# Patient Record
Sex: Male | Born: 1955 | Race: White | Hispanic: Yes | Marital: Married | Smoking: Former smoker
Health system: Southern US, Community
[De-identification: ages and names within clinical notes are randomized; demographics above are authoritative.]

## PROBLEM LIST (undated history)

## (undated) DIAGNOSIS — E538 Deficiency of other specified B group vitamins: Secondary | ICD-10-CM

## (undated) DIAGNOSIS — Z8619 Personal history of other infectious and parasitic diseases: Secondary | ICD-10-CM

## (undated) DIAGNOSIS — E785 Hyperlipidemia, unspecified: Secondary | ICD-10-CM

## (undated) DIAGNOSIS — D649 Anemia, unspecified: Secondary | ICD-10-CM

## (undated) DIAGNOSIS — E119 Type 2 diabetes mellitus without complications: Secondary | ICD-10-CM

## (undated) DIAGNOSIS — A048 Other specified bacterial intestinal infections: Secondary | ICD-10-CM

## (undated) DIAGNOSIS — F419 Anxiety disorder, unspecified: Secondary | ICD-10-CM

## (undated) DIAGNOSIS — I10 Essential (primary) hypertension: Secondary | ICD-10-CM

## (undated) DIAGNOSIS — K2970 Gastritis, unspecified, without bleeding: Secondary | ICD-10-CM

## (undated) HISTORY — PX: EYE SURGERY: SHX253

## (undated) HISTORY — PX: JOINT REPLACEMENT: SHX530

## (undated) HISTORY — PX: COLONOSCOPY: SHX174

## (undated) HISTORY — PX: OTHER SURGICAL HISTORY: SHX169

---

## 2007-07-22 ENCOUNTER — Other Ambulatory Visit: Payer: Self-pay

## 2007-07-22 ENCOUNTER — Ambulatory Visit: Payer: Self-pay | Admitting: Ophthalmology

## 2007-07-29 ENCOUNTER — Ambulatory Visit: Payer: Self-pay | Admitting: Ophthalmology

## 2008-10-29 ENCOUNTER — Ambulatory Visit: Payer: Self-pay | Admitting: Gastroenterology

## 2011-03-26 ENCOUNTER — Ambulatory Visit: Payer: Self-pay | Admitting: Internal Medicine

## 2011-04-04 ENCOUNTER — Ambulatory Visit: Payer: Self-pay | Admitting: Internal Medicine

## 2011-05-05 ENCOUNTER — Ambulatory Visit: Payer: Self-pay | Admitting: Internal Medicine

## 2011-07-09 ENCOUNTER — Ambulatory Visit: Payer: Self-pay | Admitting: Internal Medicine

## 2011-08-04 ENCOUNTER — Ambulatory Visit: Payer: Self-pay | Admitting: Internal Medicine

## 2011-12-18 ENCOUNTER — Ambulatory Visit: Payer: Self-pay | Admitting: Internal Medicine

## 2011-12-18 LAB — CBC CANCER CENTER
Basophil %: 0.6 %
Eosinophil %: 2 %
HCT: 37.1 % — ABNORMAL LOW (ref 40.0–52.0)
Lymphocyte %: 37.5 %
MCH: 28.3 pg (ref 26.0–34.0)
MCHC: 33.3 g/dL (ref 32.0–36.0)
Monocyte #: 0.4 x10 3/mm (ref 0.2–1.0)
Monocyte %: 6.7 %
Neutrophil #: 3.3 x10 3/mm (ref 1.4–6.5)
RDW: 14.2 % (ref 11.5–14.5)
WBC: 6.3 x10 3/mm (ref 3.8–10.6)

## 2011-12-18 LAB — IRON AND TIBC
Iron Bind.Cap.(Total): 295 ug/dL (ref 250–450)
Iron Saturation: 28 %

## 2011-12-18 LAB — FERRITIN: Ferritin (ARMC): 65 ng/mL (ref 8–388)

## 2012-01-02 ENCOUNTER — Ambulatory Visit: Payer: Self-pay | Admitting: Internal Medicine

## 2012-01-15 LAB — IRON AND TIBC
Iron Bind.Cap.(Total): 295 ug/dL (ref 250–450)
Iron Saturation: 22 %
Iron: 66 ug/dL (ref 65–175)

## 2012-01-15 LAB — FERRITIN: Ferritin (ARMC): 70 ng/mL (ref 8–388)

## 2012-01-15 LAB — CBC CANCER CENTER
Basophil #: 0 x10 3/mm (ref 0.0–0.1)
Basophil %: 0.8 %
Eosinophil #: 0.2 x10 3/mm (ref 0.0–0.7)
HGB: 12.8 g/dL — ABNORMAL LOW (ref 13.0–18.0)
Lymphocyte %: 29.6 %
MCH: 28.4 pg (ref 26.0–34.0)
MCHC: 33.7 g/dL (ref 32.0–36.0)
Monocyte %: 5.4 %
Neutrophil #: 3.7 x10 3/mm (ref 1.4–6.5)
RBC: 4.5 10*6/uL (ref 4.40–5.90)
RDW: 13.7 % (ref 11.5–14.5)

## 2012-02-02 ENCOUNTER — Ambulatory Visit: Payer: Self-pay | Admitting: Internal Medicine

## 2012-03-25 ENCOUNTER — Ambulatory Visit: Payer: Self-pay | Admitting: Internal Medicine

## 2012-03-25 LAB — CBC CANCER CENTER
Basophil %: 0.5 %
Eosinophil #: 0.2 x10 3/mm (ref 0.0–0.7)
Eosinophil %: 2.4 %
HCT: 35.6 % — ABNORMAL LOW (ref 40.0–52.0)
HGB: 11.9 g/dL — ABNORMAL LOW (ref 13.0–18.0)
Lymphocyte #: 2.4 x10 3/mm (ref 1.0–3.6)
MCH: 28.6 pg (ref 26.0–34.0)
MCHC: 33.5 g/dL (ref 32.0–36.0)
Monocyte #: 0.5 x10 3/mm (ref 0.2–1.0)
Neutrophil #: 3.8 x10 3/mm (ref 1.4–6.5)
Neutrophil %: 55 %
Platelet: 241 x10 3/mm (ref 150–440)
RBC: 4.18 10*6/uL — ABNORMAL LOW (ref 4.40–5.90)
RDW: 14.1 % (ref 11.5–14.5)

## 2012-04-03 ENCOUNTER — Ambulatory Visit: Payer: Self-pay | Admitting: Internal Medicine

## 2012-04-22 LAB — CBC CANCER CENTER
Basophil #: 0 x10 3/mm (ref 0.0–0.1)
Basophil %: 0.3 %
Eosinophil #: 0.1 x10 3/mm (ref 0.0–0.7)
Eosinophil %: 2 %
HCT: 36.4 % — ABNORMAL LOW (ref 40.0–52.0)
HGB: 12.3 g/dL — ABNORMAL LOW (ref 13.0–18.0)
Lymphocyte #: 2.5 x10 3/mm (ref 1.0–3.6)
Lymphocyte %: 38.2 %
MCH: 28.6 pg (ref 26.0–34.0)
MCHC: 33.8 g/dL (ref 32.0–36.0)
MCV: 85 fL (ref 80–100)
Monocyte #: 0.4 x10 3/mm (ref 0.2–1.0)
Neutrophil #: 3.5 x10 3/mm (ref 1.4–6.5)
RBC: 4.3 10*6/uL — ABNORMAL LOW (ref 4.40–5.90)
RDW: 14 % (ref 11.5–14.5)

## 2012-05-04 ENCOUNTER — Ambulatory Visit: Payer: Self-pay | Admitting: Internal Medicine

## 2012-05-16 ENCOUNTER — Ambulatory Visit: Payer: Self-pay | Admitting: Gastroenterology

## 2012-05-19 LAB — PATHOLOGY REPORT

## 2012-11-22 IMAGING — US ABDOMEN ULTRASOUND
1 series · 17 of 25 positions shown · non-contrast
Comparison: none

REASON FOR EXAM: anemia
COMMENTS:

[Series 1: abdomen ultrasound · 17 of 59 slices shown]
[im 1/59]
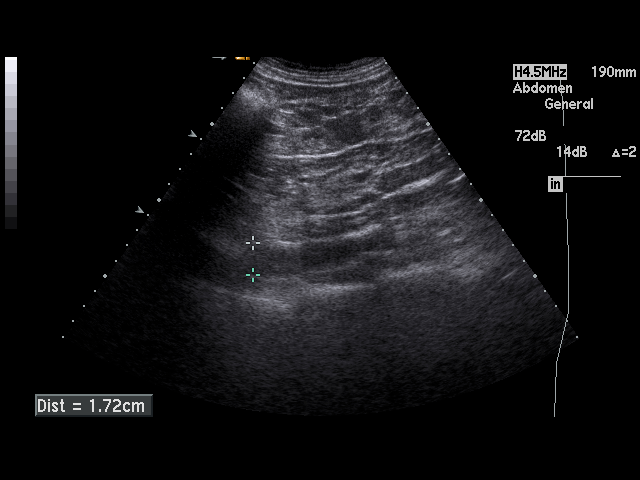
[im 5/59]
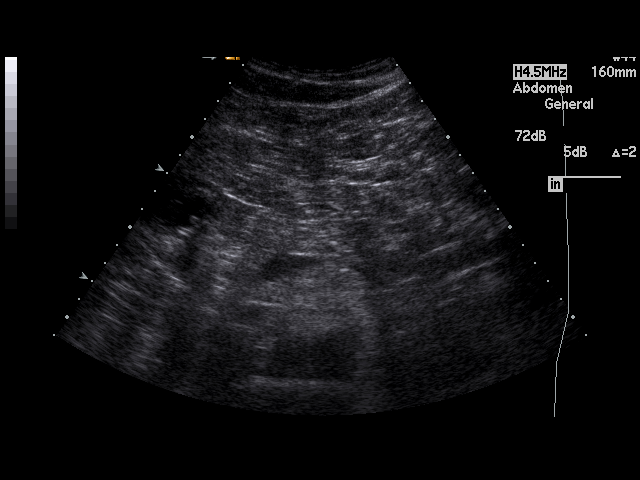
[im 8/59]
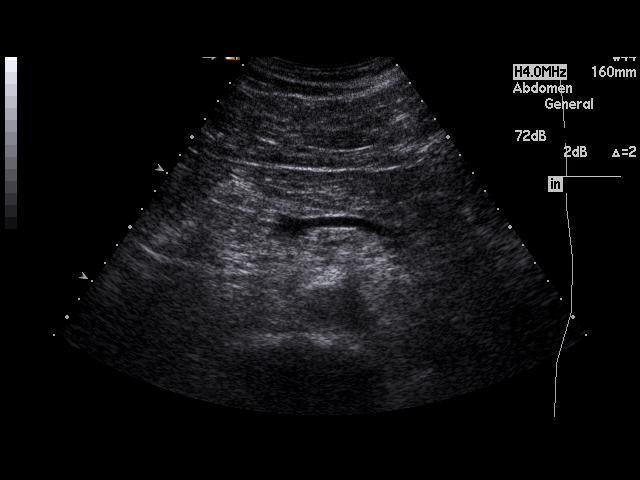
[im 13/59]
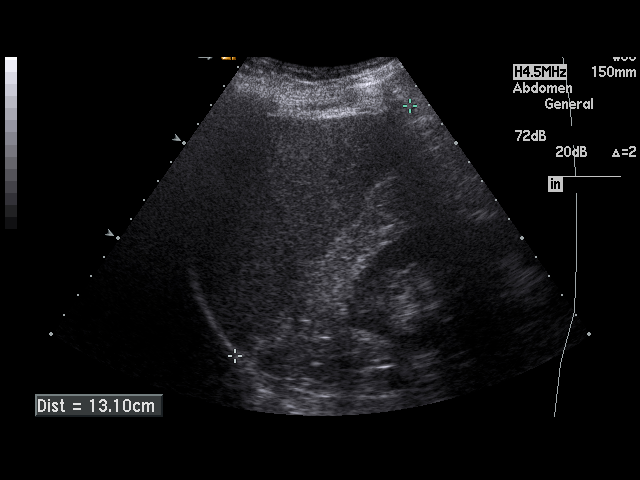
[im 15/59]
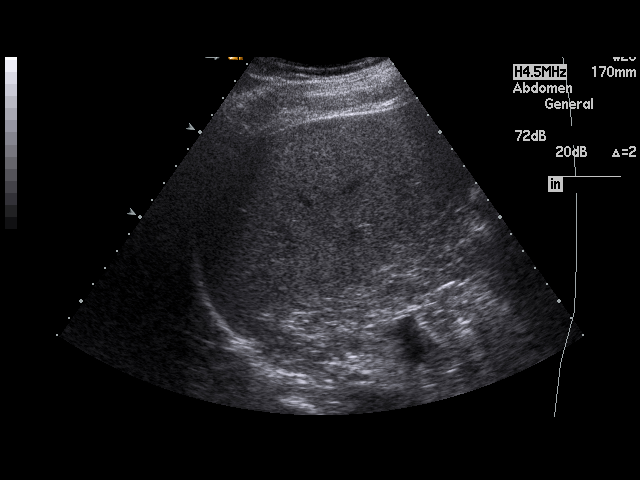
[im 20/59]
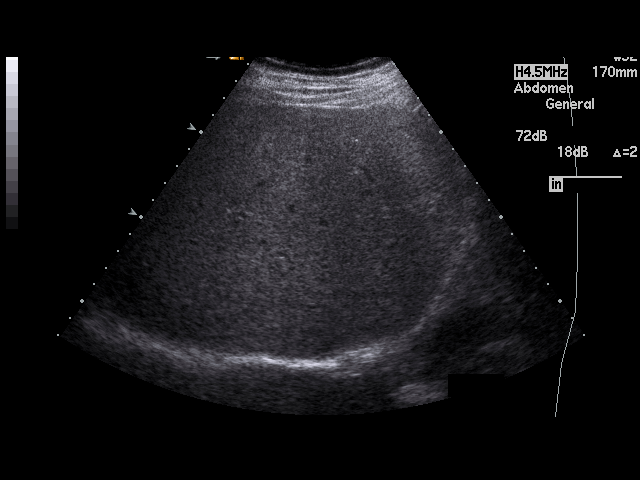
[im 22/59]
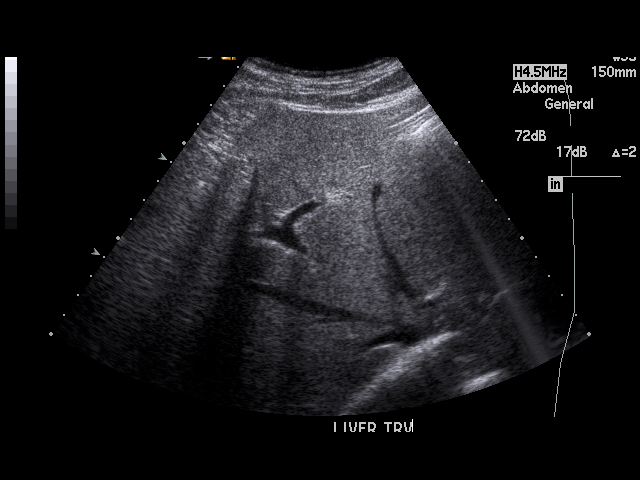
[im 27/59]
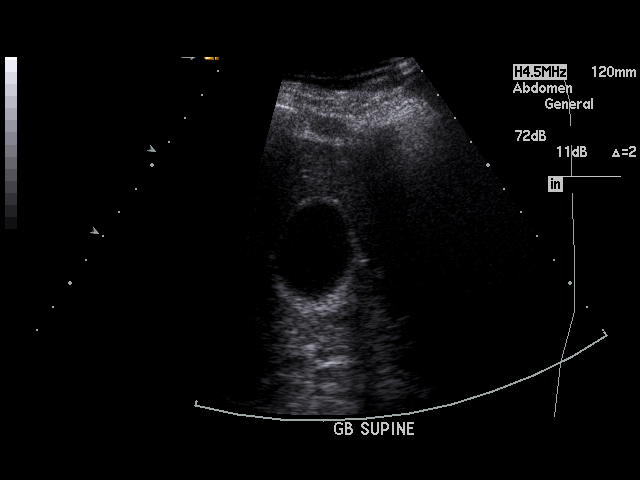
[im 30/59]
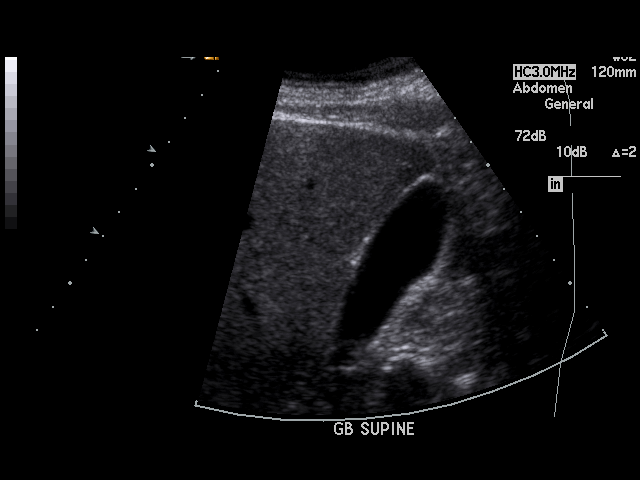
[im 32/59]
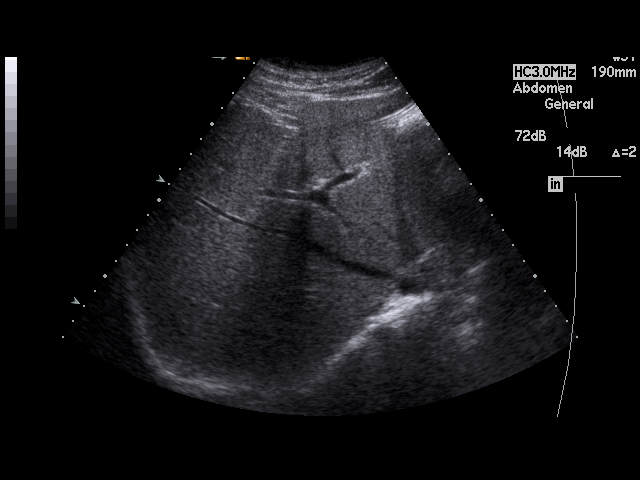
[im 37/59]
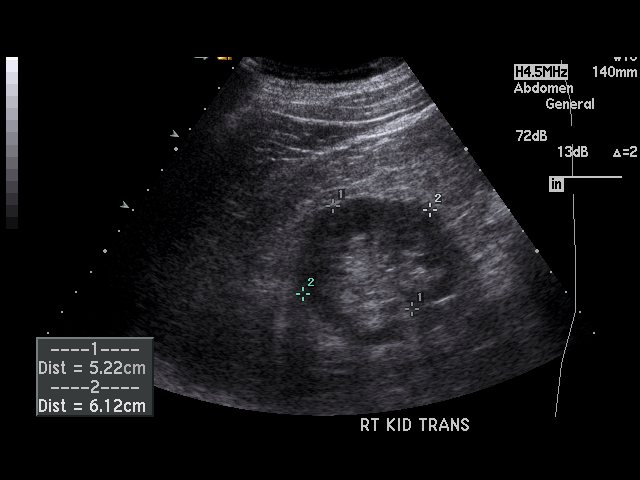
[im 39/59]
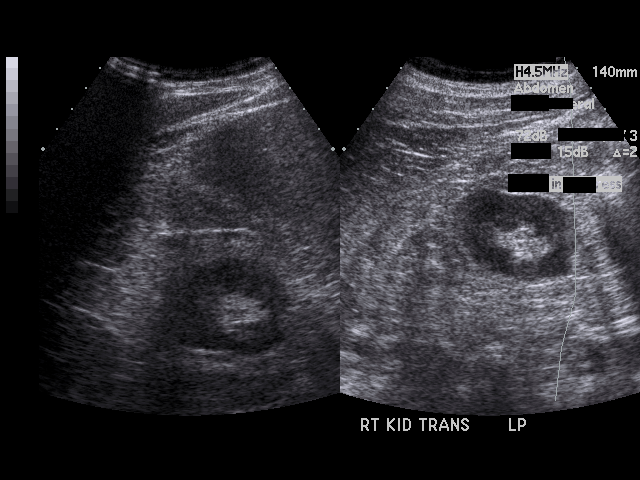
[im 44/59]
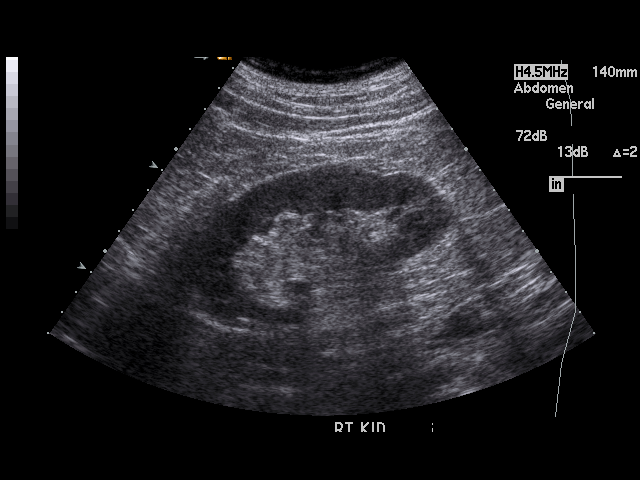
[im 46/59]
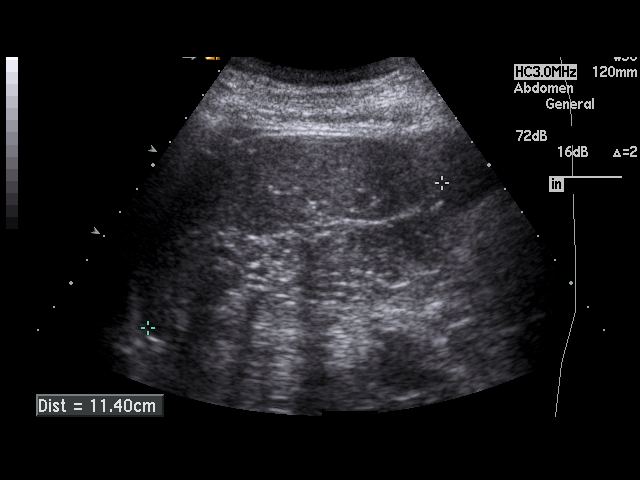
[im 51/59]
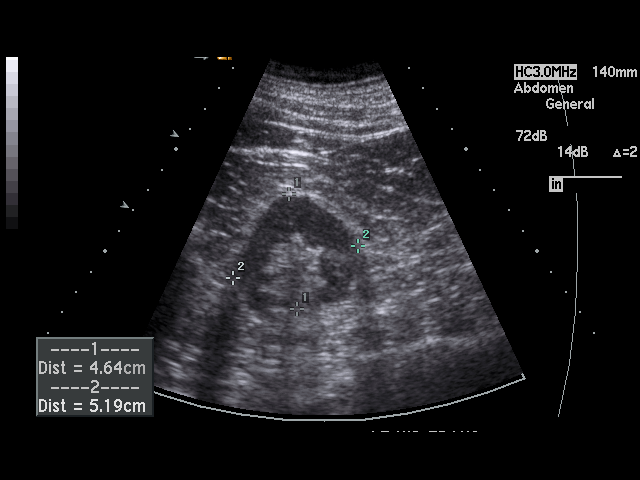
[im 54/59]
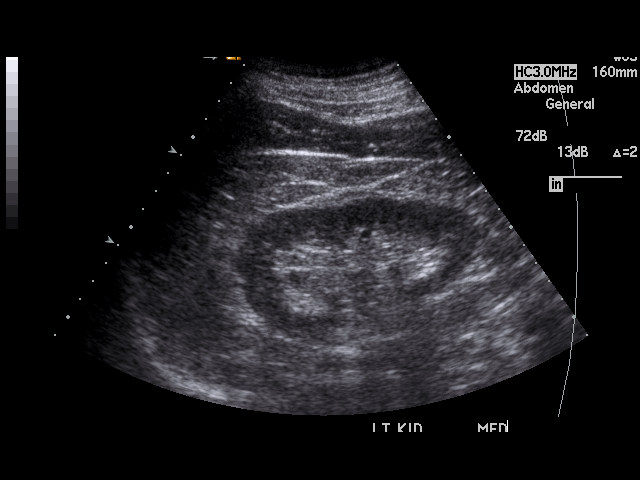
[im 59/59]
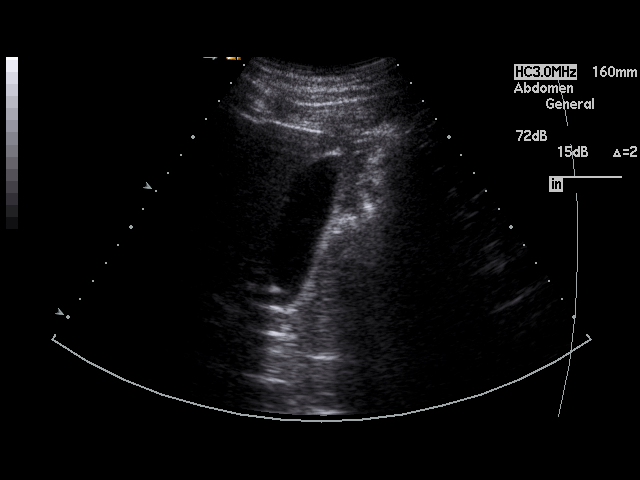

[17 of 25 positions shown; findings below may reference images not displayed]

PROCEDURE:     [facility] - [facility] ABDOMEN GENERAL SURVEY  - January 15, 2012  [DATE]

RESULT:     The liver, spleen, abdominal aorta and inferior vena cava show
no significant abnormalities. The head and body of the pancreas are
visualized and are normal in appearance. The pancreatic tail is obscured by
bowel gas. No gallstones are seen. There is no thickening of the gallbladder
wall. Common bile duct measures 3.1 mm in diameter which is within normal
limits. The kidneys show no hydronephrosis. Sagittally, the right kidney
measures 11.0 cm and the left measures 11.2 cm. No ascites is seen.
IMPRESSION: No gallstones or other significant abnormality is identified.

## 2017-02-08 DIAGNOSIS — H2513 Age-related nuclear cataract, bilateral: Secondary | ICD-10-CM | POA: Diagnosis not present

## 2017-02-13 DIAGNOSIS — E782 Mixed hyperlipidemia: Secondary | ICD-10-CM | POA: Diagnosis not present

## 2017-02-13 DIAGNOSIS — D638 Anemia in other chronic diseases classified elsewhere: Secondary | ICD-10-CM | POA: Diagnosis not present

## 2017-02-13 DIAGNOSIS — E538 Deficiency of other specified B group vitamins: Secondary | ICD-10-CM | POA: Diagnosis not present

## 2017-02-13 DIAGNOSIS — E119 Type 2 diabetes mellitus without complications: Secondary | ICD-10-CM | POA: Diagnosis not present

## 2017-02-13 DIAGNOSIS — Z79899 Other long term (current) drug therapy: Secondary | ICD-10-CM | POA: Diagnosis not present

## 2017-04-19 DIAGNOSIS — Z8619 Personal history of other infectious and parasitic diseases: Secondary | ICD-10-CM | POA: Diagnosis not present

## 2017-04-24 DIAGNOSIS — H2512 Age-related nuclear cataract, left eye: Secondary | ICD-10-CM | POA: Diagnosis not present

## 2017-05-01 ENCOUNTER — Encounter: Payer: Self-pay | Admitting: *Deleted

## 2017-05-09 ENCOUNTER — Ambulatory Visit
Admission: RE | Admit: 2017-05-09 | Discharge: 2017-05-09 | Disposition: A | Discharge status: Home / Self Care | Payer: Commercial Managed Care - PPO | Source: Ambulatory Visit | Attending: Ophthalmology | Admitting: Ophthalmology

## 2017-05-09 ENCOUNTER — Ambulatory Visit: Payer: Commercial Managed Care - PPO | Admitting: Anesthesiology

## 2017-05-09 ENCOUNTER — Encounter
Admission: RE | Disposition: A | Discharge status: Home / Self Care | Payer: Self-pay | Source: Ambulatory Visit | Attending: Ophthalmology

## 2017-05-09 ENCOUNTER — Encounter: Payer: Self-pay | Admitting: *Deleted

## 2017-05-09 DIAGNOSIS — Z79899 Other long term (current) drug therapy: Secondary | ICD-10-CM | POA: Insufficient documentation

## 2017-05-09 DIAGNOSIS — D649 Anemia, unspecified: Secondary | ICD-10-CM | POA: Diagnosis not present

## 2017-05-09 DIAGNOSIS — Z6833 Body mass index (BMI) 33.0-33.9, adult: Secondary | ICD-10-CM | POA: Diagnosis not present

## 2017-05-09 DIAGNOSIS — Z9889 Other specified postprocedural states: Secondary | ICD-10-CM | POA: Diagnosis not present

## 2017-05-09 DIAGNOSIS — E119 Type 2 diabetes mellitus without complications: Secondary | ICD-10-CM | POA: Insufficient documentation

## 2017-05-09 DIAGNOSIS — H2512 Age-related nuclear cataract, left eye: Secondary | ICD-10-CM | POA: Insufficient documentation

## 2017-05-09 DIAGNOSIS — Z7984 Long term (current) use of oral hypoglycemic drugs: Secondary | ICD-10-CM | POA: Diagnosis not present

## 2017-05-09 DIAGNOSIS — E669 Obesity, unspecified: Secondary | ICD-10-CM | POA: Insufficient documentation

## 2017-05-09 DIAGNOSIS — E7800 Pure hypercholesterolemia, unspecified: Secondary | ICD-10-CM | POA: Insufficient documentation

## 2017-05-09 DIAGNOSIS — Z87891 Personal history of nicotine dependence: Secondary | ICD-10-CM | POA: Diagnosis not present

## 2017-05-09 DIAGNOSIS — I10 Essential (primary) hypertension: Secondary | ICD-10-CM | POA: Diagnosis not present

## 2017-05-09 HISTORY — DX: Anemia, unspecified: D64.9

## 2017-05-09 HISTORY — PX: CATARACT EXTRACTION W/PHACO: SHX586

## 2017-05-09 HISTORY — DX: Essential (primary) hypertension: I10

## 2017-05-09 HISTORY — DX: Type 2 diabetes mellitus without complications: E11.9

## 2017-05-09 LAB — GLUCOSE, CAPILLARY: GLUCOSE-CAPILLARY: 162 mg/dL — AB (ref 65–99)

## 2017-05-09 SURGERY — PHACOEMULSIFICATION, CATARACT, WITH IOL INSERTION
Anesthesia: Monitor Anesthesia Care | Site: Eye | Laterality: Left | Wound class: Clean

## 2017-05-09 MED ORDER — EPINEPHRINE PF 1 MG/ML IJ SOLN
INTRAMUSCULAR | Status: AC
  Filled 2017-05-09: qty 1

## 2017-05-09 MED ORDER — BSS IO SOLN
INTRAOCULAR | Status: DC | PRN
  Administered 2017-05-09: 200 mL via INTRAOCULAR

## 2017-05-09 MED ORDER — MIDAZOLAM HCL 2 MG/2ML IJ SOLN
INTRAMUSCULAR | Status: AC
  Filled 2017-05-09: qty 2

## 2017-05-09 MED ORDER — ARMC OPHTHALMIC DILATING DROPS
OPHTHALMIC | Status: AC
  Filled 2017-05-09: qty 0.4

## 2017-05-09 MED ORDER — MOXIFLOXACIN HCL 0.5 % OP SOLN: 1.0000 [drp] | OPHTHALMIC | Status: DC | PRN

## 2017-05-09 MED ORDER — POVIDONE-IODINE 5 % OP SOLN
OPHTHALMIC | Status: DC | PRN
  Administered 2017-05-09: 1 via OPHTHALMIC

## 2017-05-09 MED ORDER — MOXIFLOXACIN HCL 0.5 % OP SOLN
OPHTHALMIC | Status: DC | PRN
  Administered 2017-05-09: 0.2 mL via OPHTHALMIC

## 2017-05-09 MED ORDER — POVIDONE-IODINE 5 % OP SOLN
OPHTHALMIC | Status: AC
  Filled 2017-05-09: qty 30

## 2017-05-09 MED ORDER — ARMC OPHTHALMIC DILATING DROPS
1.0000 "application " | OPHTHALMIC | Status: DC
  Administered 2017-05-09 (×3): 1 via OPHTHALMIC

## 2017-05-09 MED ORDER — NA CHONDROIT SULF-NA HYALURON 40-17 MG/ML IO SOLN
INTRAOCULAR | Status: AC
  Filled 2017-05-09: qty 1

## 2017-05-09 MED ORDER — MOXIFLOXACIN HCL 0.5 % OP SOLN
OPHTHALMIC | Status: AC
  Filled 2017-05-09: qty 3

## 2017-05-09 MED ORDER — LIDOCAINE HCL (PF) 4 % IJ SOLN
INTRAMUSCULAR | Status: AC
  Filled 2017-05-09: qty 5

## 2017-05-09 MED ORDER — SODIUM CHLORIDE 0.9 % IV SOLN
INTRAVENOUS | Status: DC
  Administered 2017-05-09 (×2): via INTRAVENOUS

## 2017-05-09 MED ORDER — LIDOCAINE HCL (PF) 4 % IJ SOLN
INTRAOCULAR | Status: DC | PRN
  Administered 2017-05-09: 4 mL via OPHTHALMIC

## 2017-05-09 MED ORDER — MIDAZOLAM HCL 5 MG/5ML IJ SOLN
INTRAMUSCULAR | Status: DC | PRN
  Administered 2017-05-09: 2 mg via INTRAVENOUS

## 2017-05-09 SURGICAL SUPPLY — 16 items
DISSECTOR HYDRO NUCLEUS 50X22 (MISCELLANEOUS) ×2 IMPLANT
GLOVE BIO SURGEON STRL SZ8 (GLOVE) ×2 IMPLANT
GLOVE BIOGEL M 6.5 STRL (GLOVE) ×2 IMPLANT
GLOVE SURG LX 7.5 STRW (GLOVE) ×1
GLOVE SURG LX STRL 7.5 STRW (GLOVE) ×1 IMPLANT
GOWN STRL REUS W/ TWL LRG LVL3 (GOWN DISPOSABLE) ×2 IMPLANT
GOWN STRL REUS W/TWL LRG LVL3 (GOWN DISPOSABLE) ×2
LABEL CATARACT MEDS ST (LABEL) ×2 IMPLANT
LENS IOL TECNIS ITEC 23.5 (Intraocular Lens) ×2 IMPLANT
PACK CATARACT (MISCELLANEOUS) ×2 IMPLANT
PACK CATARACT KING (MISCELLANEOUS) ×2 IMPLANT
PACK EYE AFTER SURG (MISCELLANEOUS) ×2 IMPLANT
SOL BSS BAG (MISCELLANEOUS) ×2
SOLUTION BSS BAG (MISCELLANEOUS) ×1 IMPLANT
WATER STERILE IRR 250ML POUR (IV SOLUTION) ×2 IMPLANT
WIPE NON LINTING 3.25X3.25 (MISCELLANEOUS) ×2 IMPLANT

## 2017-05-09 NOTE — Anesthesia Postprocedure Evaluation (Signed)
Anesthesia Post Note  Patient: Greg Stevenson  Procedure(s) Performed: Procedure(s) (LRB): CATARACT EXTRACTION PHACO AND INTRAOCULAR LENS PLACEMENT (IOC) (Left)  Patient location during evaluation: PACU Anesthesia Type: MAC Level of consciousness: awake and alert Pain management: pain level controlled Vital Signs Assessment: post-procedure vital signs reviewed and stable Respiratory status: spontaneous breathing, nonlabored ventilation and respiratory function stable Cardiovascular status: stable and blood pressure returned to baseline Anesthetic complications: no     Last Vitals:  Vitals:   05/09/17 0751 05/09/17 0946  BP: (!) 181/84 (!) 132/56  Pulse: 66   Resp: 17   Temp: 36.7 C 36.8 C  SpO2: 100% 100%    Last Pain:  Vitals:   05/09/17 0946  TempSrc: Oral                 Silvana Newness A

## 2017-05-09 NOTE — Transfer of Care (Signed)
Immediate Anesthesia Transfer of Care Note  Patient: Greg Stevenson  Procedure(s) Performed: Procedure(s) with comments: CATARACT EXTRACTION PHACO AND INTRAOCULAR LENS PLACEMENT (IOC) (Left) - Lot # [id number] H [place]:   00:18.5 AP%:  10.9 CDE:    2.02  Patient Location: PACU  Anesthesia Type:MAC  Level of Consciousness: awake, alert , oriented and patient cooperative  Airway & Oxygen Therapy: Patient Spontanous Breathing  Post-op Assessment: Report given to RN, Post -op Vital signs reviewed and stable and Patient moving all extremities X 4  Post vital signs: Reviewed and stable  Last Vitals:  Vitals:   05/09/17 0751 05/09/17 0946  BP: (!) 181/84 (!) 132/56  Pulse: 66   Resp: 17   Temp: 36.7 C 36.8 C  SpO2: 100% 100%    Last Pain:  Vitals:   05/09/17 0946  TempSrc: Oral         Complications: No apparent anesthesia complications

## 2017-05-09 NOTE — Anesthesia Preprocedure Evaluation (Signed)
Anesthesia Evaluation  Patient identified by MRN, date of birth, ID band Patient awake    Reviewed: Allergy & Precautions, NPO status , Patient's Chart, lab work & pertinent test results, reviewed documented beta blocker date and time   Airway Mallampati: III  TM Distance: >3 FB     Dental  (+) Chipped   Pulmonary former smoker,           Cardiovascular hypertension, Pt. on medications and Pt. on home beta blockers      Neuro/Psych    GI/Hepatic   Endo/Other  diabetes, Type 2  Renal/GU      Musculoskeletal   Abdominal   Peds  Hematology  (+) anemia ,   Anesthesia Other Findings Obese.  Reproductive/Obstetrics                             Anesthesia Physical Anesthesia Plan  ASA: III  Anesthesia Plan: MAC   Post-op Pain Management:    Induction:   PONV Risk Score and Plan:   Airway Management Planned:   Additional Equipment:   Intra-op Plan:   Post-operative Plan:   Informed Consent: I have reviewed the patients History and Physical, chart, labs and discussed the procedure including the risks, benefits and alternatives for the proposed anesthesia with the patient or authorized representative who has indicated his/her understanding and acceptance.     Plan Discussed with: CRNA  Anesthesia Plan Comments:         Anesthesia Quick Evaluation

## 2017-05-09 NOTE — H&P (Signed)
The History and Physical notes are on paper, have been signed, and are to be scanned.   I have examined the patient and there are no changes to the H&P.   Willey BladeBradley Satin Boal 05/09/2017 9:05 AM

## 2017-05-09 NOTE — Discharge Instructions (Signed)
Eye Surgery Discharge Instructions  Expect mild scratchy sensation or mild soreness. DO NOT RUB YOUR EYE!  The day of surgery:  Minimal physical activity, but bed rest is not required  No reading, computer work, or close hand work  No bending, lifting, or straining.  May watch TV  For 24 hours:  No driving, legal decisions, or alcoholic beverages  Safety precautions  Eat anything you prefer: It is better to start with liquids, then soup then solid foods.  _____ Eye patch should be worn until postoperative exam tomorrow.  ____ Solar shield eyeglasses should be worn for comfort in the sunlight/patch while sleeping  Resume all regular medications including aspirin or Coumadin if these were discontinued prior to surgery. You may shower, bathe, shave, or wash your hair. Tylenol may be taken for mild discomfort.  Call your doctor if you experience significant pain, nausea, or vomiting, fever > 101 or other signs of infection. [phone] or [phone] Specific instructions:  Follow-up Information    [place] CraneKing, Bradley Mark, MD Follow up.   Specialty:  Ophthalmology Why:  september 7 at 9:35am Contact information: [address] [place] [address] [phone]

## 2017-05-09 NOTE — Op Note (Signed)
OPERATIVE NOTE  Greg Stevenson [id number] 05/09/2017   PREOPERATIVE DIAGNOSIS:  Nuclear sclerotic cataract left eye.  H25.12   POSTOPERATIVE DIAGNOSIS:    Nuclear sclerotic cataract left eye.     PROCEDURE:  Phacoemusification with posterior chamber intraocular lens placement of the left eye   LENS:   Implant Name Type Inv. Item Serial No. Manufacturer Lot No. LRB No. Used  LENS IOL DIOP 23.5 - [id number] Intraocular Lens LENS IOL DIOP 23.5 [id number] AMO   Left 1       PCB00 +23.5   ULTRASOUND TIME: 0 minutes 18 seconds.  CDE 2.02   SURGEON:  Willey BladeBradley Jeanelle Dake, MD, MPH   ANESTHESIA:  Topical with tetracaine drops augmented with 1% preservative-free intracameral lidocaine.  ESTIMATED BLOOD LOSS: <1 mL   COMPLICATIONS:  None.   DESCRIPTION OF PROCEDURE:  The patient was identified in the holding room and transported to the operating room and placed in the supine position under the operating microscope.  The left eye was identified as the operative eye and it was prepped and draped in the usual sterile ophthalmic fashion.   A 1.0 millimeter clear-corneal paracentesis was made at the 5:00 position. 0.5 ml of preservative-free 1% lidocaine with epinephrine was injected into the anterior chamber.  The anterior chamber was filled with Discovisc viscoelastic.  A 2.4 millimeter keratome was used to make a near-clear corneal incision at the 2:00 position.  A curvilinear capsulorrhexis was made with a cystotome and capsulorrhexis forceps.  Balanced salt solution was used to hydrodissect and hydrodelineate the nucleus.   Phacoemulsification was then used in stop and chop fashion to remove the lens nucleus and epinucleus.  The remaining cortex was then removed using the irrigation and aspiration handpiece. Discovisc was then placed into the capsular bag to distend it for lens placement.  A lens was then injected into the capsular bag.  The remaining viscoelastic was aspirated.   Wounds were  hydrated with balanced salt solution.  The anterior chamber was inflated to a physiologic pressure with balanced salt solution.  Intracameral vigamox 0.1 mL undiltued was injected into the eye and a drop placed onto the ocular surface.  No wound leaks were noted.  The patient was taken to the recovery room in stable condition without complications of anesthesia or surgery  Willey BladeBradley Ermalinda Joubert 05/09/2017, 9:42 AM

## 2017-05-09 NOTE — Anesthesia Post-op Follow-up Note (Signed)
Anesthesia QCDR form completed.        

## 2017-05-15 ENCOUNTER — Encounter: Payer: Self-pay | Admitting: Ophthalmology

## 2017-06-14 DIAGNOSIS — H2511 Age-related nuclear cataract, right eye: Secondary | ICD-10-CM | POA: Diagnosis not present

## 2017-06-25 ENCOUNTER — Encounter: Payer: Self-pay | Admitting: *Deleted

## 2017-06-27 ENCOUNTER — Ambulatory Visit: Payer: Commercial Managed Care - PPO | Admitting: Anesthesiology

## 2017-06-27 ENCOUNTER — Encounter
Admission: RE | Disposition: A | Discharge status: Home / Self Care | Payer: Self-pay | Source: Ambulatory Visit | Attending: Ophthalmology

## 2017-06-27 ENCOUNTER — Encounter: Payer: Self-pay | Admitting: *Deleted

## 2017-06-27 ENCOUNTER — Ambulatory Visit
Admission: RE | Admit: 2017-06-27 | Discharge: 2017-06-27 | Disposition: A | Discharge status: Home / Self Care | Payer: Commercial Managed Care - PPO | Source: Ambulatory Visit | Attending: Ophthalmology | Admitting: Ophthalmology

## 2017-06-27 DIAGNOSIS — I10 Essential (primary) hypertension: Secondary | ICD-10-CM | POA: Insufficient documentation

## 2017-06-27 DIAGNOSIS — Z9849 Cataract extraction status, unspecified eye: Secondary | ICD-10-CM | POA: Insufficient documentation

## 2017-06-27 DIAGNOSIS — H2511 Age-related nuclear cataract, right eye: Secondary | ICD-10-CM | POA: Insufficient documentation

## 2017-06-27 DIAGNOSIS — Z87891 Personal history of nicotine dependence: Secondary | ICD-10-CM | POA: Insufficient documentation

## 2017-06-27 DIAGNOSIS — E1136 Type 2 diabetes mellitus with diabetic cataract: Secondary | ICD-10-CM | POA: Diagnosis not present

## 2017-06-27 HISTORY — PX: CATARACT EXTRACTION W/PHACO: SHX586

## 2017-06-27 LAB — GLUCOSE, CAPILLARY: GLUCOSE-CAPILLARY: 214 mg/dL — AB (ref 65–99)

## 2017-06-27 SURGERY — PHACOEMULSIFICATION, CATARACT, WITH IOL INSERTION
Anesthesia: Monitor Anesthesia Care | Site: Eye | Laterality: Right | Wound class: Clean

## 2017-06-27 MED ORDER — NA CHONDROIT SULF-NA HYALURON 40-17 MG/ML IO SOLN
INTRAOCULAR | Status: AC
  Filled 2017-06-27: qty 1

## 2017-06-27 MED ORDER — MIDAZOLAM HCL 2 MG/2ML IJ SOLN
INTRAMUSCULAR | Status: DC | PRN
  Administered 2017-06-27: 2 mg via INTRAVENOUS

## 2017-06-27 MED ORDER — MOXIFLOXACIN HCL 0.5 % OP SOLN
OPHTHALMIC | Status: AC
  Filled 2017-06-27: qty 3

## 2017-06-27 MED ORDER — MOXIFLOXACIN HCL 0.5 % OP SOLN: 1.0000 [drp] | OPHTHALMIC | Status: DC | PRN

## 2017-06-27 MED ORDER — EPINEPHRINE PF 1 MG/ML IJ SOLN
INTRAOCULAR | Status: DC | PRN
  Administered 2017-06-27: 08:00:00 via OPHTHALMIC

## 2017-06-27 MED ORDER — PROVISC 10 MG/ML IO SOLN
INTRAOCULAR | Status: DC | PRN
  Administered 2017-06-27: 0.85 mL via INTRAOCULAR

## 2017-06-27 MED ORDER — ARMC OPHTHALMIC DILATING DROPS
1.0000 "application " | OPHTHALMIC | Status: AC
  Administered 2017-06-27 (×3): 1 via OPHTHALMIC

## 2017-06-27 MED ORDER — MOXIFLOXACIN HCL 0.5 % OP SOLN
OPHTHALMIC | Status: DC | PRN
  Administered 2017-06-27: 0.2 mL via OPHTHALMIC

## 2017-06-27 MED ORDER — POVIDONE-IODINE 5 % OP SOLN
OPHTHALMIC | Status: DC | PRN
  Administered 2017-06-27: 1 via OPHTHALMIC

## 2017-06-27 MED ORDER — MIDAZOLAM HCL 2 MG/2ML IJ SOLN
INTRAMUSCULAR | Status: AC
  Filled 2017-06-27: qty 2

## 2017-06-27 MED ORDER — CARBACHOL 0.01 % IO SOLN
INTRAOCULAR | Status: DC | PRN
  Administered 2017-06-27: 0.5 mL via INTRAOCULAR

## 2017-06-27 MED ORDER — POVIDONE-IODINE 5 % OP SOLN
OPHTHALMIC | Status: AC
  Filled 2017-06-27: qty 30

## 2017-06-27 MED ORDER — EPINEPHRINE PF 1 MG/ML IJ SOLN
INTRAMUSCULAR | Status: AC
  Filled 2017-06-27: qty 1

## 2017-06-27 MED ORDER — LIDOCAINE HCL (PF) 4 % IJ SOLN
INTRAMUSCULAR | Status: AC
  Filled 2017-06-27: qty 5

## 2017-06-27 MED ORDER — ARMC OPHTHALMIC DILATING DROPS
OPHTHALMIC | Status: AC
  Administered 2017-06-27: 1 via OPHTHALMIC
  Filled 2017-06-27: qty 0.4

## 2017-06-27 MED ORDER — SODIUM CHLORIDE 0.9 % IV SOLN
INTRAVENOUS | Status: DC
  Administered 2017-06-27: 07:00:00 via INTRAVENOUS

## 2017-06-27 MED ORDER — LIDOCAINE HCL (PF) 4 % IJ SOLN
INTRAOCULAR | Status: DC | PRN
  Administered 2017-06-27: 4 mL via OPHTHALMIC

## 2017-06-27 SURGICAL SUPPLY — 16 items
DISSECTOR HYDRO NUCLEUS 50X22 (MISCELLANEOUS) ×3 IMPLANT
GLOVE BIO SURGEON STRL SZ8 (GLOVE) ×3 IMPLANT
GLOVE BIOGEL M 6.5 STRL (GLOVE) ×3 IMPLANT
GLOVE SURG LX 7.5 STRW (GLOVE) ×2
GLOVE SURG LX STRL 7.5 STRW (GLOVE) ×1 IMPLANT
GOWN STRL REUS W/ TWL LRG LVL3 (GOWN DISPOSABLE) ×2 IMPLANT
GOWN STRL REUS W/TWL LRG LVL3 (GOWN DISPOSABLE) ×4
LABEL CATARACT MEDS ST (LABEL) ×3 IMPLANT
LENS IOL TECNIS ITEC 23.5 (Intraocular Lens) ×3 IMPLANT
PACK CATARACT (MISCELLANEOUS) ×3 IMPLANT
PACK CATARACT KING (MISCELLANEOUS) ×3 IMPLANT
PACK EYE AFTER SURG (MISCELLANEOUS) ×3 IMPLANT
SOL BSS BAG (MISCELLANEOUS) ×3
SOLUTION BSS BAG (MISCELLANEOUS) ×1 IMPLANT
WATER STERILE IRR 250ML POUR (IV SOLUTION) ×3 IMPLANT
WIPE NON LINTING 3.25X3.25 (MISCELLANEOUS) ×3 IMPLANT

## 2017-06-27 NOTE — H&P (Signed)
The History and Physical notes are on paper, have been signed, and are to be scanned.   I have examined the patient and there are no changes to the H&P.   Willey BladeBradley Moustapha Tooker 06/27/2017 7:26 AM

## 2017-06-27 NOTE — Anesthesia Preprocedure Evaluation (Signed)
Anesthesia Evaluation  Patient identified by MRN, date of birth, ID band Patient awake    Reviewed: Allergy & Precautions, H&P , NPO status , Patient's Chart, lab work & pertinent test results, reviewed documented beta blocker date and time   History of Anesthesia Complications Negative for: history of anesthetic complications  Airway Mallampati: III  TM Distance: >3 FB Neck ROM: full    Dental  (+) Dental Advidsory Given, Teeth Intact   Pulmonary neg pulmonary ROS, former smoker,           Cardiovascular Exercise Tolerance: Good hypertension, (-) angina(-) CAD, (-) Past MI, (-) Cardiac Stents and (-) CABG (-) dysrhythmias (-) Valvular Problems/Murmurs     Neuro/Psych negative neurological ROS  negative psych ROS   GI/Hepatic Neg liver ROS, GERD  ,  Endo/Other  diabetes  Renal/GU negative Renal ROS  negative genitourinary   Musculoskeletal   Abdominal   Peds  Hematology negative hematology ROS (+)   Anesthesia Other Findings Past Medical History: No date: Anemia No date: Diabetes mellitus without complication (HCC) No date: Hypertension   Reproductive/Obstetrics negative OB ROS                             Anesthesia Physical Anesthesia Plan  ASA: II  Anesthesia Plan: MAC   Post-op Pain Management:    Induction: Intravenous  PONV Risk Score and Plan: 1 and Ondansetron and Dexamethasone  Airway Management Planned: Natural Airway and Nasal Cannula  Additional Equipment:   Intra-op Plan:   Post-operative Plan:   Informed Consent: I have reviewed the patients History and Physical, chart, labs and discussed the procedure including the risks, benefits and alternatives for the proposed anesthesia with the patient or authorized representative who has indicated his/her understanding and acceptance.   Dental Advisory Given  Plan Discussed with: Anesthesiologist, CRNA and  Surgeon  Anesthesia Plan Comments:         Anesthesia Quick Evaluation

## 2017-06-27 NOTE — Discharge Instructions (Signed)
Eye Surgery Discharge Instructions  Expect mild scratchy sensation or mild soreness. DO NOT RUB YOUR EYE!  The day of surgery:  Minimal physical activity, but bed rest is not required  No reading, computer work, or close hand work  No bending, lifting, or straining.  May watch TV  For 24 hours:  No driving, legal decisions, or alcoholic beverages  Safety precautions  Eat anything you prefer: It is better to start with liquids, then soup then solid foods.  _____ Eye patch should be worn until postoperative exam tomorrow.  __x__ Solar shield eyeglasses should be worn for comfort in the sunlight/patch while sleeping  Patch to be worn at bedtime for one week.   Resume all regular medications including aspirin or Coumadin if these were discontinued prior to surgery.  You may shower, bathe, shave, or wash your hair.  Tylenol may be taken for mild discomfort.  Call your doctor if you experience significant pain, nausea, or vomiting, fever > 101 or other signs of infection. [phone] or [phone] Specific instructions:  Follow-up Information    [place] CraneKing, Bradley Mark, MD Follow up on 06/28/2017.   Specialty:  Ophthalmology Why:  Follow up will be in the [place] Office at 9:15 am. Directions provided. Contact information: [address] [place] [address] [phone]

## 2017-06-27 NOTE — Anesthesia Procedure Notes (Signed)
Performed by: COOK-MARTIN, Icesis Renn Pre-anesthesia Checklist: Patient identified, Emergency Drugs available, Suction available, Patient being monitored and Timeout performed Patient Re-evaluated:Patient Re-evaluated prior to induction Oxygen Delivery Method: Nasal cannula Preoxygenation: Pre-oxygenation with 100% oxygen Induction Type: IV induction Placement Confirmation: positive ETCO2 and CO2 detector       

## 2017-06-27 NOTE — Transfer of Care (Signed)
Immediate Anesthesia Transfer of Care Note  Patient: Greg Stevenson  Procedure(s) Performed: CATARACT EXTRACTION PHACO AND INTRAOCULAR LENS PLACEMENT (IOC)-RIGHT DIABETIC (Right Eye)  Patient Location: PACU  Anesthesia Type:MAC  Level of Consciousness: awake, alert  and oriented  Airway & Oxygen Therapy: RA  Post-op Assessment: Report given to RN and Post -op Vital signs reviewed and stable  Post vital signs: Reviewed and stable  Last Vitals:  Vitals:   06/27/17 0621  BP: (!) 172/71  Pulse: 62  Resp: 18  Temp: (!) 35.9 C  SpO2: 99%    Last Pain:  Vitals:   06/27/17 0621  TempSrc: Oral         Complications: No apparent anesthesia complications

## 2017-06-27 NOTE — Op Note (Signed)
OPERATIVE NOTE  Greg Stevenson [id number] 06/27/2017   PREOPERATIVE DIAGNOSIS:  Nuclear sclerotic cataract right eye.  H25.11   POSTOPERATIVE DIAGNOSIS:    Nuclear sclerotic cataract right eye.     PROCEDURE:  Phacoemusification with posterior chamber intraocular lens placement of the right eye   LENS:   Implant Name Type Inv. Item Serial No. Manufacturer Lot No. LRB No. Used  LENS IOL DIOP 23.5 - [id number] Intraocular Lens LENS IOL DIOP 23.5 [id number] AMO   Right 1       PCB00 + 23.5   ULTRASOUND TIME: 0 minutes 26.9 seconds.  CDE 1.78   SURGEON:  Willey BladeBradley Jasiya Markie, MD, MPH  ANESTHESIOLOGIST: Anesthesiologist: Lenard SimmerKarenz, Andrew, MD CRNA: Tonia Ghentook-Martin, Cindy   ANESTHESIA:  Topical with tetracaine drops augmented with 1% preservative-free intracameral lidocaine.  ESTIMATED BLOOD LOSS: less than 1 mL.   COMPLICATIONS:  None.   DESCRIPTION OF PROCEDURE:  The patient was identified in the holding room and transported to the operating room and placed in the supine position under the operating microscope.  The right eye was identified as the operative eye and it was prepped and draped in the usual sterile ophthalmic fashion.   A 1.0 millimeter clear-corneal paracentesis was made at the 10:30 position. 0.5 ml of preservative-free 1% lidocaine with epinephrine was injected into the anterior chamber.  The anterior chamber was filled with Discovisc viscoelastic.  A 2.4 millimeter keratome was used to make a near-clear corneal incision at the 8:00 position.  A curvilinear capsulorrhexis was made with a cystotome and capsulorrhexis forceps.  Balanced salt solution was used to hydrodissect and hydrodelineate the nucleus.   Phacoemulsification was then used in stop and chop fashion to remove the lens nucleus and epinucleus.  The remaining cortex was then removed using the irrigation and aspiration handpiece. Discovisc was then placed into the capsular bag to distend it for lens placement.  A lens  was then injected into the capsular bag.  The remaining viscoelastic was aspirated.   Wounds were hydrated with balanced salt solution.  The anterior chamber was inflated to a physiologic pressure with balanced salt solution.   Intracameral vigamox 0.1 mL undiluted was injected into the eye and a drop placed onto the ocular surface.  No wound leaks were noted.  The patient was taken to the recovery room in stable condition without complications of anesthesia or surgery  Willey BladeBradley Sirr Kabel 06/27/2017, 8:05 AM

## 2017-06-27 NOTE — Anesthesia Post-op Follow-up Note (Signed)
Anesthesia QCDR form completed.        

## 2017-06-27 NOTE — Anesthesia Postprocedure Evaluation (Signed)
Anesthesia Post Note  Patient: Greg Stevenson  Procedure(s) Performed: CATARACT EXTRACTION PHACO AND INTRAOCULAR LENS PLACEMENT (IOC)-RIGHT DIABETIC (Right Eye)  Patient location during evaluation: PACU Anesthesia Type: MAC Level of consciousness: awake and alert Pain management: pain level controlled Vital Signs Assessment: post-procedure vital signs reviewed and stable Respiratory status: spontaneous breathing, nonlabored ventilation, respiratory function stable and patient connected to nasal cannula oxygen Cardiovascular status: stable and blood pressure returned to baseline Postop Assessment: no apparent nausea or vomiting Anesthetic complications: no     Last Vitals:  Vitals:   06/27/17 0807 06/27/17 0823  BP: 113/66 114/62  Pulse: (!) 59 62  Resp: 15 15  Temp: 36.5 C   SpO2: 99% 100%    Last Pain:  Vitals:   06/27/17 0807  TempSrc: Temporal                 Martha Clan

## 2017-06-28 ENCOUNTER — Encounter: Payer: Self-pay | Admitting: Ophthalmology

## 2017-08-15 DIAGNOSIS — E538 Deficiency of other specified B group vitamins: Secondary | ICD-10-CM | POA: Diagnosis not present

## 2017-08-15 DIAGNOSIS — D638 Anemia in other chronic diseases classified elsewhere: Secondary | ICD-10-CM | POA: Diagnosis not present

## 2017-08-15 DIAGNOSIS — E119 Type 2 diabetes mellitus without complications: Secondary | ICD-10-CM | POA: Diagnosis not present

## 2017-08-15 DIAGNOSIS — Z23 Encounter for immunization: Secondary | ICD-10-CM | POA: Diagnosis not present

## 2017-08-15 DIAGNOSIS — Z79899 Other long term (current) drug therapy: Secondary | ICD-10-CM | POA: Diagnosis not present

## 2017-08-15 DIAGNOSIS — Z0000 Encounter for general adult medical examination without abnormal findings: Secondary | ICD-10-CM | POA: Diagnosis not present

## 2017-08-15 DIAGNOSIS — E782 Mixed hyperlipidemia: Secondary | ICD-10-CM | POA: Diagnosis not present

## 2017-09-16 ENCOUNTER — Ambulatory Visit: Payer: Self-pay | Admitting: Anesthesiology

## 2017-09-16 ENCOUNTER — Encounter: Payer: Self-pay | Admitting: *Deleted

## 2017-09-16 ENCOUNTER — Ambulatory Visit
Admission: RE | Admit: 2017-09-16 | Discharge: 2017-09-16 | Disposition: A | Discharge status: Home / Self Care | Payer: Self-pay | Source: Ambulatory Visit | Attending: Gastroenterology | Admitting: Gastroenterology

## 2017-09-16 ENCOUNTER — Encounter
Admission: RE | Disposition: A | Discharge status: Home / Self Care | Payer: Self-pay | Source: Ambulatory Visit | Attending: Gastroenterology

## 2017-09-16 ENCOUNTER — Other Ambulatory Visit: Payer: Self-pay

## 2017-09-16 DIAGNOSIS — Z1211 Encounter for screening for malignant neoplasm of colon: Secondary | ICD-10-CM | POA: Insufficient documentation

## 2017-09-16 DIAGNOSIS — I10 Essential (primary) hypertension: Secondary | ICD-10-CM | POA: Diagnosis not present

## 2017-09-16 DIAGNOSIS — K640 First degree hemorrhoids: Secondary | ICD-10-CM | POA: Insufficient documentation

## 2017-09-16 DIAGNOSIS — Z7984 Long term (current) use of oral hypoglycemic drugs: Secondary | ICD-10-CM | POA: Insufficient documentation

## 2017-09-16 DIAGNOSIS — E785 Hyperlipidemia, unspecified: Secondary | ICD-10-CM | POA: Insufficient documentation

## 2017-09-16 DIAGNOSIS — E119 Type 2 diabetes mellitus without complications: Secondary | ICD-10-CM | POA: Insufficient documentation

## 2017-09-16 DIAGNOSIS — Z79899 Other long term (current) drug therapy: Secondary | ICD-10-CM | POA: Insufficient documentation

## 2017-09-16 DIAGNOSIS — Z86010 Personal history of colonic polyps: Secondary | ICD-10-CM | POA: Diagnosis not present

## 2017-09-16 DIAGNOSIS — E538 Deficiency of other specified B group vitamins: Secondary | ICD-10-CM | POA: Insufficient documentation

## 2017-09-16 DIAGNOSIS — Z7982 Long term (current) use of aspirin: Secondary | ICD-10-CM | POA: Insufficient documentation

## 2017-09-16 HISTORY — DX: Other specified bacterial intestinal infections: A04.8

## 2017-09-16 HISTORY — PX: COLONOSCOPY WITH PROPOFOL: SHX5780

## 2017-09-16 HISTORY — DX: Deficiency of other specified B group vitamins: E53.8

## 2017-09-16 HISTORY — DX: Hyperlipidemia, unspecified: E78.5

## 2017-09-16 HISTORY — DX: Gastritis, unspecified, without bleeding: K29.70

## 2017-09-16 LAB — GLUCOSE, CAPILLARY: GLUCOSE-CAPILLARY: 123 mg/dL — AB (ref 65–99)

## 2017-09-16 SURGERY — COLONOSCOPY WITH PROPOFOL
Anesthesia: General

## 2017-09-16 MED ORDER — PROPOFOL 500 MG/50ML IV EMUL
INTRAVENOUS | Status: DC | PRN
  Administered 2017-09-16: 180 ug/kg/min via INTRAVENOUS

## 2017-09-16 MED ORDER — FENTANYL CITRATE (PF) 100 MCG/2ML IJ SOLN
INTRAMUSCULAR | Status: AC
  Filled 2017-09-16: qty 2

## 2017-09-16 MED ORDER — MIDAZOLAM HCL 2 MG/2ML IJ SOLN
INTRAMUSCULAR | Status: DC | PRN
  Administered 2017-09-16: 2 mg via INTRAVENOUS

## 2017-09-16 MED ORDER — FENTANYL CITRATE (PF) 100 MCG/2ML IJ SOLN
INTRAMUSCULAR | Status: DC | PRN
  Administered 2017-09-16: 50 ug via INTRAVENOUS

## 2017-09-16 MED ORDER — SODIUM CHLORIDE 0.9 % IV SOLN: INTRAVENOUS | Status: DC

## 2017-09-16 MED ORDER — MIDAZOLAM HCL 2 MG/2ML IJ SOLN
INTRAMUSCULAR | Status: AC
  Filled 2017-09-16: qty 2

## 2017-09-16 MED ORDER — PROPOFOL 500 MG/50ML IV EMUL
INTRAVENOUS | Status: AC
  Filled 2017-09-16: qty 50

## 2017-09-16 MED ORDER — LIDOCAINE HCL (PF) 2 % IJ SOLN
INTRAMUSCULAR | Status: AC
  Filled 2017-09-16: qty 10

## 2017-09-16 MED ORDER — PHENYLEPHRINE HCL 10 MG/ML IJ SOLN
INTRAMUSCULAR | Status: DC | PRN
  Administered 2017-09-16: 100 ug via INTRAVENOUS

## 2017-09-16 MED ORDER — PROPOFOL 10 MG/ML IV BOLUS
INTRAVENOUS | Status: DC | PRN
  Administered 2017-09-16: 40 mg via INTRAVENOUS

## 2017-09-16 MED ORDER — SODIUM CHLORIDE 0.9 % IV SOLN
INTRAVENOUS | Status: DC
  Administered 2017-09-16 (×2): via INTRAVENOUS

## 2017-09-16 NOTE — Anesthesia Post-op Follow-up Note (Signed)
Anesthesia QCDR form completed.        

## 2017-09-16 NOTE — Op Note (Signed)
[facility] Patient Name: Greg Stevenson Procedure Date: 09/16/2017 10:38 AM MRN: [id number] Account #: [id number] Date of Birth: 04-Jul-1956 Admit Type: Outpatient Age: 62 Room: [facility] ENDO ROOM 1 Gender: Male Note Status: Finalized Procedure:            Colonoscopy Indications:          Personal history of colonic polyps Providers:            Christena Deem, MD Referring MD:         Neomia Dear. Harrington Challenger, MD (Referring MD) Medicines:            Monitored Anesthesia Care Complications:        No immediate complications. Procedure:            Pre-Anesthesia Assessment:                       - ASA Grade Assessment: III - A patient with severe                        systemic disease.                       After obtaining informed consent, the colonoscope was                        passed under direct vision. Throughout the procedure,                        the patient's blood pressure, pulse, and oxygen                        saturations were monitored continuously. The                        Colonoscope was introduced through the anus and                        advanced to the the cecum, identified by appendiceal                        orifice and ileocecal valve. The colonoscopy was                        performed without difficulty. The patient tolerated the                        procedure well. The quality of the bowel preparation                        was fair. Findings:      Non-bleeding internal hemorrhoids were found during retroflexion and       during anoscopy. The hemorrhoids were small and Grade I (internal       hemorrhoids that do not prolapse).      The digital rectal exam was normal.      The exam was otherwise without abnormality. Impression:           - Preparation of the colon was fair.                       - Non-bleeding internal hemorrhoids.                       -  No specimens collected. Recommendation:       - Discharge  patient to home. Procedure Code(s):    --- Professional ---                       [phone], Colonoscopy, flexible; diagnostic, including                        collection of specimen(s) by brushing or washing, when                        performed (separate procedure) Diagnosis Code(s):    --- Professional ---                       K64.0, First degree hemorrhoids                       Z86.010, Personal history of colonic polyps CPT copyright 2016 American Medical Association. All rights reserved. The codes documented in this report are preliminary and upon coder review may  be revised to meet current compliance requirements. Christena DeemMartin U Kamica Florance, MD 09/16/2017 11:10:45 AM This report has been signed electronically. Number of Addenda: 0 Note Initiated On: 09/16/2017 10:38 AM Scope Withdrawal Time: 0 hours 9 minutes 46 seconds  Total Procedure Duration: 0 hours 18 minutes 8 seconds       [facility]

## 2017-09-16 NOTE — H&P (Signed)
Outpatient short stay form Pre-procedure 09/16/2017 10:31 AM Christena Deem U  MD  Primary Physician: Dr. Hal Moralesavid Theis  Reason for visit:  Colonoscopy  History of present illness:  Patient is a 62-year-old male presenting today as above. He has a personal history of adenomatous colon polyps with his last colonoscopy being in 2013. He tolerated his prep well. He does take a daily 81 mg aspirin. He takes no other aspirin products or blood thinning agents.    Current Facility-Administered Medications:  .  0.9 %  sodium chloride infusion, , Intravenous, Continuous, Christena Deem,  U, MD, Last Rate: 20 mL/hr at 09/16/17 1020 .  0.9 %  sodium chloride infusion, , Intravenous, Continuous, Christena Deem,  U, MD  Medications Prior to Admission  Medication Sig Dispense Refill Last Dose  . aspirin EC 81 MG tablet Take 81 mg by mouth daily.   09/15/2017 at 0800  . atenolol (TENORMIN) 50 MG tablet Take 50 mg by mouth daily.   09/16/2017 at 0700  . chlorthalidone (HYGROTON) 25 MG tablet Take 25 mg by mouth daily.   09/16/2017 at 0700  . glimepiride (AMARYL) 2 MG tablet Take 2 mg by mouth daily with breakfast.   09/15/2017 at 1000  . lisinopril (PRINIVIL,ZESTRIL) 40 MG tablet Take 40 mg by mouth daily.   09/16/2017 at 0700  . lovastatin (MEVACOR) 40 MG tablet Take 40 mg by mouth at bedtime.   Past Week at Unknown time  . metFORMIN (GLUCOPHAGE-XR) 500 MG 24 hr tablet Take 500 mg by mouth 2 (two) times daily.   09/15/2017 at 1200  . vitamin B-12 (CYANOCOBALAMIN) 500 MCG tablet Take 500 mcg by mouth daily.   09/15/2017 at 0800  . metFORMIN (GLUCOPHAGE) 500 MG tablet Take 1,000 mg by mouth 2 (two) times daily with a meal.    06/25/2017     No Known Allergies   Past Medical History:  Diagnosis Date  . Anemia   . Diabetes mellitus without complication (HCC)   . Gastritis   . H. pylori infection   . Hyperlipidemia   . Hypertension   . Vitamin B12 deficiency     Review of systems:      Physical  Exam    Heart and lungs: Regular rate and rhythm without rub or gallop, lungs are bilaterally clear.    HEENT: Normocephalic atraumatic eyes are anicteric    Other:     Pertinant exam for procedure: Soft nontender nondistended bowel sounds positive normoactive    Planned proceedures: Colonoscopy and indicated procedures. I have discussed the risks benefits and complications of procedures to include not limited to bleeding, infection, perforation and the risk of sedation and the patient wishes to proceed.    Christena Deem U , MD Gastroenterology 09/16/2017  10:31 AM

## 2017-09-16 NOTE — Transfer of Care (Signed)
Immediate Anesthesia Transfer of Care Note  Patient: Greg Stevenson  Procedure(s) Performed: COLONOSCOPY WITH PROPOFOL (N/A )  Patient Location: PACU  Anesthesia Type:General  Level of Consciousness: awake  Airway & Oxygen Therapy: Patient Spontanous Breathing and Patient connected to nasal cannula oxygen  Post-op Assessment: Report given to RN and Post -op Vital signs reviewed and stable  Post vital signs: Reviewed and stable  Last Vitals:  Vitals:   09/16/17 1003 09/16/17 1124  BP: (!) 187/87   Pulse: 66   Resp: 18   Temp: (!) 35.6 C (!) (P) 35.6 C  SpO2: 100%     Last Pain:  Vitals:   09/16/17 1124  TempSrc: (P) Tympanic         Complications: No apparent anesthesia complications

## 2017-09-16 NOTE — Anesthesia Procedure Notes (Signed)
Date/Time: 09/16/2017 10:45 AM Performed by: Henrietta HooverPope, [place]remont Gavitt, CRNA Pre-anesthesia Checklist: Patient identified, Emergency Drugs available, Patient being monitored, Suction available and Timeout performed Patient Re-evaluated:Patient Re-evaluated prior to induction Oxygen Delivery Method: Nasal cannula Placement Confirmation: positive ETCO2 Dental Injury: Teeth and Oropharynx as per pre-operative assessment

## 2017-09-16 NOTE — Anesthesia Preprocedure Evaluation (Signed)
Anesthesia Evaluation  Patient identified by MRN, date of birth, ID band Patient awake    Reviewed: Allergy & Precautions, NPO status , Patient's Chart, lab work & pertinent test results  History of Anesthesia Complications Negative for: history of anesthetic complications  Airway Mallampati: III       Dental   Pulmonary neg sleep apnea, neg COPD, former smoker,           Cardiovascular hypertension, Pt. on medications and Pt. on home beta blockers (-) Past MI and (-) CHF (-) dysrhythmias (-) Valvular Problems/Murmurs     Neuro/Psych neg Seizures    GI/Hepatic Neg liver ROS, neg GERD  ,  Endo/Other  diabetes, Type 2, Oral Hypoglycemic Agents  Renal/GU negative Renal ROS     Musculoskeletal   Abdominal   Peds  Hematology   Anesthesia Other Findings   Reproductive/Obstetrics                             Anesthesia Physical Anesthesia Plan  ASA: III  Anesthesia Plan: General   Post-op Pain Management:    Induction: Intravenous  PONV Risk Score and Plan: 2 and TIVA and Propofol infusion  Airway Management Planned: Nasal Cannula  Additional Equipment:   Intra-op Plan:   Post-operative Plan:   Informed Consent: I have reviewed the patients History and Physical, chart, labs and discussed the procedure including the risks, benefits and alternatives for the proposed anesthesia with the patient or authorized representative who has indicated his/her understanding and acceptance.     Plan Discussed with:   Anesthesia Plan Comments:         Anesthesia Quick Evaluation

## 2017-09-16 NOTE — Anesthesia Postprocedure Evaluation (Signed)
Anesthesia Post Note  Patient: Greg Stevenson  Procedure(s) Performed: COLONOSCOPY WITH PROPOFOL (N/A )  Patient location during evaluation: Endoscopy Anesthesia Type: General Level of consciousness: awake and alert Pain management: pain level controlled Vital Signs Assessment: post-procedure vital signs reviewed and stable Respiratory status: spontaneous breathing and respiratory function stable Cardiovascular status: stable Anesthetic complications: no     Last Vitals:  Vitals:   09/16/17 1003 09/16/17 1124  BP: (!) 187/87   Pulse: 66   Resp: 18   Temp: (!) 35.6 C (!) 35.6 C  SpO2: 100%     Last Pain:  Vitals:   09/16/17 1124  TempSrc: Tympanic                 Aarna Mihalko K

## 2017-09-17 ENCOUNTER — Encounter: Payer: Self-pay | Admitting: Gastroenterology

## 2018-01-24 ENCOUNTER — Ambulatory Visit
Admission: EM | Admit: 2018-01-24 | Discharge: 2018-01-24 | Disposition: A | Discharge status: Home / Self Care | Payer: PRIVATE HEALTH INSURANCE | Attending: Family Medicine | Admitting: Family Medicine

## 2018-01-24 ENCOUNTER — Encounter: Payer: Self-pay | Admitting: *Deleted

## 2018-01-24 DIAGNOSIS — R21 Rash and other nonspecific skin eruption: Secondary | ICD-10-CM

## 2018-01-24 DIAGNOSIS — L255 Unspecified contact dermatitis due to plants, except food: Secondary | ICD-10-CM

## 2018-01-24 MED ORDER — PREDNISONE 10 MG PO TABS: ORAL_TABLET | ORAL | 0 refills | Status: DC

## 2018-01-24 NOTE — Discharge Instructions (Signed)
Medication as prescribed.  It will elevate you sugar.  Keep a close eye.  If your sugar gets greater than 400 please let your primary care physician know.  Take care  Dr. Adriana Simas

## 2018-01-24 NOTE — ED Triage Notes (Signed)
Rash to face and arms. Pt states he was working on the yard and thinks he got poison [employer]

## 2018-01-24 NOTE — ED Provider Notes (Signed)
[facility]    CSN: [id number] Arrival date & time: 01/24/18  6578  History   Chief Complaint Chief Complaint  Patient presents with  . Rash   HPI  62 year old male presents with rash.  Patient has been working outside.  He is concerned that he got into poison ivy or poison oak.  States that his rash started yesterday morning.  Moderate in severity. It is very itchy.  Located around the eyes and on the forehead as well as on his arms.  He is tried some over-the-counter ointment without improvement.  No known exacerbating factors.  No other reported symptoms.  No other complaints.  Past Medical History:  Diagnosis Date  . Anemia   . Diabetes mellitus without complication (HCC)   . Gastritis   . H. pylori infection   . Hyperlipidemia   . Hypertension   . Vitamin B12 deficiency    Past Surgical History:  Procedure Laterality Date  . CATARACT EXTRACTION W/PHACO Left 05/09/2017   Procedure: CATARACT EXTRACTION PHACO AND INTRAOCULAR LENS PLACEMENT (IOC);  Surgeon: Nevada Crane, MD;  Location: [facility] ORS;  Service: Ophthalmology;  Laterality: Left;  Lot # [id number] H [place]:   00:18.5 AP%:  10.9 CDE:    2.02  . CATARACT EXTRACTION W/PHACO Right 06/27/2017   Procedure: CATARACT EXTRACTION PHACO AND INTRAOCULAR LENS PLACEMENT (IOC)-RIGHT DIABETIC;  Surgeon: Nevada Crane, MD;  Location: [facility] ORS;  Service: Ophthalmology;  Laterality: Right;  [place] 00:26.9 AP% 6.7 CDE 1.78 Fluid Pack lot #[id number]  . COLONOSCOPY    . COLONOSCOPY WITH PROPOFOL N/A 09/16/2017   Procedure: COLONOSCOPY WITH PROPOFOL;  Surgeon: Christena Deem, MD;  Location: [facility];  Service: Endoscopy;  Laterality: N/A;  . EYE SURGERY     Home Medications    Prior to Admission medications   Medication Sig Start Date End Date Taking? Authorizing Provider  aspirin EC 81 MG tablet Take 81 mg by mouth daily.   Yes [provider]  atenolol (TENORMIN) 50 MG tablet Take 50 mg by mouth  daily.   Yes [provider]  chlorthalidone (HYGROTON) 25 MG tablet Take 25 mg by mouth daily.   Yes [provider]  glimepiride (AMARYL) 2 MG tablet Take 2 mg by mouth daily with breakfast.   Yes [provider]  lisinopril (PRINIVIL,ZESTRIL) 40 MG tablet Take 40 mg by mouth daily.   Yes [provider]  lovastatin (MEVACOR) 40 MG tablet Take 40 mg by mouth at bedtime.   Yes [provider]  metFORMIN (GLUCOPHAGE) 500 MG tablet Take 1,000 mg by mouth 2 (two) times daily with a meal.    Yes [provider]  vitamin B-12 (CYANOCOBALAMIN) 500 MCG tablet Take 500 mcg by mouth daily.   Yes [provider]  metFORMIN (GLUCOPHAGE-XR) 500 MG 24 hr tablet Take 500 mg by mouth 2 (two) times daily.    [provider]  predniSONE (DELTASONE) 10 MG tablet 50 mg daily x 3 days, then 40 mg daily x 3 days, then 30 mg daily x 3 days, then 20 mg daily x 3 days, then 10 mg daily x 2 days. 01/24/18   Tommie Sams, DO   Family History No Known Problems Brother    No Known Problems Brother    No Known Problems Brother    No Known Problems Brother    No Known Problems Brother    Tremor Father    No Known Problems Maternal Grandfather  No Known Problems Maternal Grandmother    Diabetes type II Mother    No Known Problems Paternal Grandfather    No Known Problems Paternal Grandmother    No Known Problems Sister    No Known Problems Sister    No Known Problems Sister    Breast cancer Neg Hx    Colon cancer Neg Hx    Coronary Artery Disease (Blocked arteries around heart) Neg Hx    Prostate cancer Neg Hx     Social History Social History   Tobacco Use  . Smoking status: Former Games developer  . Smokeless tobacco: Never Used  Substance Use Topics  . Alcohol use: Yes    Comment: occasionally  . Drug use: No   Allergies   Patient has no known allergies.  Review of Systems Review of Systems    Constitutional: Negative.  Negative for activity change.  Skin: Positive for rash.   Physical Exam Triage Vital Signs ED Triage Vitals [01/24/18 0855]  Enc Vitals Group     BP      Pulse      Resp      Temp      Temp src      SpO2      Weight 204 lb (92.5 kg)     Height  (1.651 m)     Head Circumference      Peak Flow      Pain Score 3     Pain Loc      Pain Edu?      Excl. in GC?    Updated Vital Signs Ht  (1.651 m)   Wt 204 lb (92.5 kg)   BMI 33.95 kg/m   Physical Exam  Constitutional: He appears well-developed. No distress.  HENT:  Head: Normocephalic and atraumatic.  Eyes: Conjunctivae are normal. Right eye exhibits no discharge. Left eye exhibits no discharge.  Pulmonary/Chest: Effort normal. No respiratory distress.  Neurological: He is alert.  Skin:  Periorbital edema and erythema.  Patient with vesicular rash noted on his upper extremities as well as his forehead.  Psychiatric: He has a normal mood and affect. His behavior is normal.  Nursing note and vitals reviewed.  UC Treatments / Results  Labs (all labs ordered are listed, but only abnormal results are displayed) Labs Reviewed - No data to display  EKG None  Radiology No results found.  Procedures Procedures (including critical care time)  Medications Ordered in UC Medications - No data to display  Initial Impression / Assessment and Plan / UC Course  I have reviewed the triage vital signs and the nursing notes.  Pertinent labs & imaging results that were available during my care of the patient were reviewed by me and considered in my medical decision making (see chart for details).    62 year old male presents with dermatitis secondary to poison oak or poison ivy.  Given facial involvement, I am treating him with prednisone.  I advised him that this will elevate his blood sugars.  He is to keep a close eye.  He is to notify his primary care physician if they become markedly  elevated so that adjustments can be made.  Patient is in agreement.  Final Clinical Impressions(s) / UC Diagnoses   Final diagnoses:  Dermatitis due to plants, including poison ivy, sumac, and oak     Discharge Instructions     Medication as prescribed.  It will elevate you sugar.  Keep a close eye.  If  your sugar gets greater than 400 please let your primary care physician know.  Take care  Dr. Adriana Simas     ED Prescriptions    Medication Sig Dispense Auth. Provider   predniSONE (DELTASONE) 10 MG tablet 50 mg daily x 3 days, then 40 mg daily x 3 days, then 30 mg daily x 3 days, then 20 mg daily x 3 days, then 10 mg daily x 2 days. 44 tablet Tommie Sams, DO     Controlled Substance Prescriptions  Controlled Substance Registry consulted? Not Applicable   Tommie Sams, [place] 01/24/18 1610

## 2019-02-18 ENCOUNTER — Emergency Department: Payer: Commercial Managed Care - PPO

## 2019-02-18 ENCOUNTER — Emergency Department
Admission: EM | Admit: 2019-02-18 | Discharge: 2019-02-19 | Disposition: A | Discharge status: Other Acute Inpatient Hospital | Payer: Commercial Managed Care - PPO | Attending: Student in an Organized Health Care Education/Training Program | Admitting: Student in an Organized Health Care Education/Training Program

## 2019-02-18 ENCOUNTER — Other Ambulatory Visit: Payer: Self-pay

## 2019-02-18 DIAGNOSIS — Z79899 Other long term (current) drug therapy: Secondary | ICD-10-CM | POA: Insufficient documentation

## 2019-02-18 DIAGNOSIS — Z7984 Long term (current) use of oral hypoglycemic drugs: Secondary | ICD-10-CM | POA: Diagnosis not present

## 2019-02-18 DIAGNOSIS — I10 Essential (primary) hypertension: Secondary | ICD-10-CM | POA: Insufficient documentation

## 2019-02-18 DIAGNOSIS — R531 Weakness: Secondary | ICD-10-CM

## 2019-02-18 DIAGNOSIS — Z87891 Personal history of nicotine dependence: Secondary | ICD-10-CM | POA: Diagnosis not present

## 2019-02-18 DIAGNOSIS — E871 Hypo-osmolality and hyponatremia: Secondary | ICD-10-CM | POA: Diagnosis not present

## 2019-02-18 DIAGNOSIS — E119 Type 2 diabetes mellitus without complications: Secondary | ICD-10-CM | POA: Insufficient documentation

## 2019-02-18 DIAGNOSIS — U071 COVID-19: Secondary | ICD-10-CM | POA: Insufficient documentation

## 2019-02-18 DIAGNOSIS — J1289 Other viral pneumonia: Secondary | ICD-10-CM | POA: Insufficient documentation

## 2019-02-18 LAB — COMPREHENSIVE METABOLIC PANEL
ALT: 93 U/L — ABNORMAL HIGH (ref 0–44)
AST: 67 U/L — ABNORMAL HIGH (ref 15–41)
Albumin: 2.7 g/dL — ABNORMAL LOW (ref 3.5–5.0)
Alkaline Phosphatase: 82 U/L (ref 38–126)
Anion gap: 12 (ref 5–15)
BUN: 11 mg/dL (ref 6–20)
CO2: 30 mmol/L (ref 22–32)
Calcium: 8.8 mg/dL — ABNORMAL LOW (ref 8.9–10.3)
Chloride: 69 mmol/L — ABNORMAL LOW (ref 98–111)
Creatinine, Ser: 0.86 mg/dL (ref 0.61–1.24)
GFR calc Af Amer: >60 mL/min (ref >60)
GFR calc non Af Amer: >60 mL/min (ref >60)
Glucose, Bld: 151 mg/dL — ABNORMAL HIGH (ref 70–99)
Potassium: 2.4 mmol/L — CL (ref 3.5–5.1)
Sodium: 111 mmol/L — CL (ref 135–145)
Total Bilirubin: 0.8 mg/dL (ref 0.3–1.2)
Total Protein: 7.4 g/dL (ref 6.5–8.1)

## 2019-02-18 LAB — CBC
HCT: 30.1 % — ABNORMAL LOW (ref 39.0–52.0)
Hemoglobin: 11.1 g/dL — ABNORMAL LOW (ref 13.0–17.0)
MCH: 28 pg (ref 26.0–34.0)
MCHC: 36.9 g/dL — ABNORMAL HIGH (ref 30.0–36.0)
MCV: 75.8 fL — ABNORMAL LOW (ref 80.0–100.0)
Platelets: 393 10*3/uL (ref 150–400)
RBC: 3.97 MIL/uL — ABNORMAL LOW (ref 4.22–5.81)
RDW: 12.4 % (ref 11.5–15.5)
WBC: 8.1 10*3/uL (ref 4.0–10.5)
nRBC: 0 % (ref 0.0–0.2)

## 2019-02-18 LAB — URINALYSIS, COMPLETE (UACMP) WITH MICROSCOPIC
Bacteria, UA: NONE SEEN
Bilirubin Urine: NEGATIVE
Glucose, UA: 50 mg/dL — AB
Hgb urine dipstick: NEGATIVE
Ketones, ur: NEGATIVE mg/dL
Leukocytes,Ua: NEGATIVE
Nitrite: NEGATIVE
Protein, ur: 30 mg/dL — AB
Specific Gravity, Urine: 1.012 (ref 1.005–1.030)
pH: 8 (ref 5.0–8.0)

## 2019-02-18 LAB — BASIC METABOLIC PANEL
Anion gap: 15 (ref 5–15)
BUN: 11 mg/dL (ref 6–20)
CO2: 28 mmol/L (ref 22–32)
Calcium: 8.5 mg/dL — ABNORMAL LOW (ref 8.9–10.3)
Chloride: 67 mmol/L — ABNORMAL LOW (ref 98–111)
Creatinine, Ser: 0.69 mg/dL (ref 0.61–1.24)
GFR calc Af Amer: >60 mL/min (ref >60)
GFR calc non Af Amer: >60 mL/min (ref >60)
Glucose, Bld: 165 mg/dL — ABNORMAL HIGH (ref 70–99)
Potassium: 2.4 mmol/L — CL (ref 3.5–5.1)
Sodium: 110 mmol/L — CL (ref 135–145)

## 2019-02-18 LAB — LIPASE, BLOOD: Lipase: 31 U/L (ref 11–51)

## 2019-02-18 LAB — MAGNESIUM: Magnesium: 1.9 mg/dL (ref 1.7–2.4)

## 2019-02-18 LAB — SARS CORONAVIRUS 2 BY RT PCR (HOSPITAL ORDER, PERFORMED IN ~~LOC~~ HOSPITAL LAB): SARS Coronavirus 2: POSITIVE — AB

## 2019-02-18 MED ORDER — SODIUM CHLORIDE 0.9% FLUSH
3.0000 mL | Freq: Once | INTRAVENOUS | Status: AC
  Administered 2019-02-18: 3 mL via INTRAVENOUS

## 2019-02-18 MED ORDER — POTASSIUM CHLORIDE CRYS ER 20 MEQ PO TBCR
40.0000 meq | EXTENDED_RELEASE_TABLET | Freq: Once | ORAL | Status: AC
  Administered 2019-02-18: 40 meq via ORAL
  Filled 2019-02-18: qty 2

## 2019-02-18 MED ORDER — SODIUM CHLORIDE 0.9 % IV BOLUS
1000.0000 mL | Freq: Once | INTRAVENOUS | Status: AC
  Administered 2019-02-18: 1000 mL via INTRAVENOUS

## 2019-02-18 MED ORDER — POTASSIUM CHLORIDE IN NACL 20-0.9 MEQ/L-% IV SOLN
Freq: Once | INTRAVENOUS | Status: AC
  Administered 2019-02-18: 20:00:00 via INTRAVENOUS
  Filled 2019-02-18: qty 1000

## 2019-02-18 NOTE — ED Notes (Signed)
Report given to [facility] 

## 2019-02-18 NOTE — ED Notes (Signed)
ED Provider at bedside. 

## 2019-02-18 NOTE — ED Provider Notes (Signed)
[facility] Emergency Department Provider Note    First MD Initiated Contact with Patient 02/18/19 1539     (approximate)  I have reviewed the triage vital signs and the nursing notes.   HISTORY  Chief Complaint Weakness    HPI Fredda Hammedazario Briles is a 63 y.o. male spent speaking male with the below listed past medical history presents the ER for generalized malaise weakness due to decreased oral intake frequent nausea vomiting and diarrhea.  Patient was diagnosed with coronavirus on the eighth of this month.  He is denying any cough or shortness of breath but has been unable to keep any medication down has been unable to keep any food down but has been drinking water to try to stay hydrated.  Is complaining of muscle aches and generalized weakness.  No seizures.  No numbness or tingling.    Past Medical History:  Diagnosis Date   Anemia    Diabetes mellitus without complication (HCC)    Gastritis    H. pylori infection    Hyperlipidemia    Hypertension    Vitamin B12 deficiency    No family history on file. Past Surgical History:  Procedure Laterality Date   CATARACT EXTRACTION W/PHACO Left 05/09/2017   Procedure: CATARACT EXTRACTION PHACO AND INTRAOCULAR LENS PLACEMENT (IOC);  Surgeon: Nevada CraneKing, Bradley Mark, MD;  Location: [facility] ORS;  Service: Ophthalmology;  Laterality: Left;  Lot # [id number] H [place]:   00:18.5 AP%:  10.9 CDE:    2.02   CATARACT EXTRACTION W/PHACO Right 06/27/2017   Procedure: CATARACT EXTRACTION PHACO AND INTRAOCULAR LENS PLACEMENT (IOC)-RIGHT DIABETIC;  Surgeon: Nevada CraneKing, Bradley Mark, MD;  Location: [facility] ORS;  Service: Ophthalmology;  Laterality: Right;  US 00:26.9 AP% 6.7 CDE 1.78 Fluid Pack lot #[id number]   COLONOSCOPY     COLONOSCOPY WITH PROPOFOL N/A 09/16/2017   Procedure: COLONOSCOPY WITH PROPOFOL;  Surgeon: Christena DeemSkulskie, Martin U, MD;  [facility];  Service: Endoscopy;  Laterality: N/A;   EYE SURGERY     There are  no active problems to display for this patient.     Prior to Admission medications   Medication Sig Start Date End Date Taking? Authorizing Provider  atenolol (TENORMIN) 50 MG tablet Take 50 mg by mouth daily.   Yes [provider]  busPIRone (BUSPAR) 15 MG tablet Take 5-15 mg by mouth 2 (two) times a day. 02/16/19 02/16/20 Yes [provider]  chlorthalidone (HYGROTON) 25 MG tablet Take 25 mg by mouth daily.   Yes [provider]  glimepiride (AMARYL) 2 MG tablet Take 2 mg by mouth daily with breakfast.   Yes [provider]  lisinopril (PRINIVIL,ZESTRIL) 40 MG tablet Take 40 mg by mouth daily.   Yes [provider]  lovastatin (MEVACOR) 40 MG tablet Take 40 mg by mouth at bedtime.   Yes [provider]  metFORMIN (GLUCOPHAGE-XR) 500 MG 24 hr tablet Take 500 mg by mouth 2 (two) times daily.   Yes [provider]    Allergies Patient has no known allergies.    Social History Social History   Tobacco Use   Smoking status: Former Smoker   Smokeless tobacco: Never Used  Substance Use Topics   Alcohol use: Yes    Comment: occasionally   Drug use: No    Review of Systems Patient denies headaches, rhinorrhea, blurry vision, numbness, shortness of breath, chest pain, edema, cough, abdominal pain, nausea, vomiting, diarrhea, dysuria, fevers, rashes or hallucinations unless otherwise stated above in  HPI. ____________________________________________   PHYSICAL EXAM:  VITAL SIGNS: Vitals:   02/18/19 1830 02/18/19 1900  BP: (!) 133/56 139/60  Pulse: 63 61  Resp: 12 13  Temp:    SpO2: 98% 97%    Constitutional: Alert and oriented. Frail and weak appearing Eyes: Conjunctivae are normal.  Head: Atraumatic. Nose: No congestion/rhinnorhea. Mouth/Throat: Mucous membranes are moist.   Neck: No stridor. Painless ROM.  Cardiovascular: Normal rate, regular rhythm. Grossly normal heart sounds.  Good peripheral  circulation. Respiratory: Normal respiratory effort.  No retractions. Lungs CTAB. Gastrointestinal: Soft and nontender. No distention. No abdominal bruits. No CVA tenderness. Genitourinary:  Musculoskeletal: No lower extremity tenderness nor edema.  No joint effusions. Neurologic:  Normal speech and language. No gross focal neurologic deficits are appreciated. No facial droop Skin:  Skin is warm, dry and intact. No rash noted. Psychiatric: Mood and affect are normal. Speech and behavior are normal.  ____________________________________________   LABS (all labs ordered are listed, but only abnormal results are displayed)  Results for orders placed or performed during the hospital encounter of 02/18/19 (from the past 24 hour(s))  Basic metabolic panel     Status: Abnormal   Collection Time: 02/18/19  2:54 PM  Result Value Ref Range   Sodium 110 (LL) 135 - 145 mmol/L   Potassium 2.4 (LL) 3.5 - 5.1 mmol/L   Chloride 67 (L) 98 - 111 mmol/L   CO2 28 22 - 32 mmol/L   Glucose, Bld 165 (H) 70 - 99 mg/dL   BUN 11 6 - 20 mg/dL   Creatinine, Ser 1.610.69 0.61 - 1.24 mg/dL   Calcium 8.5 (L) 8.9 - 10.3 mg/dL   GFR calc non Af Amer >60 >60 mL/min   GFR calc Af Amer >60 >60 mL/min   Anion gap 15 5 - 15  CBC     Status: Abnormal   Collection Time: 02/18/19  2:54 PM  Result Value Ref Range   WBC 8.1 4.0 - 10.5 K/uL   RBC 3.97 (L) 4.22 - 5.81 MIL/uL   Hemoglobin 11.1 (L) 13.0 - 17.0 g/dL   HCT 09.630.1 (L) 04.539.0 - 40.952.0 %   MCV 75.8 (L) 80.0 - 100.0 fL   MCH 28.0 26.0 - 34.0 pg   MCHC 36.9 (H) 30.0 - 36.0 g/dL   RDW 81.112.4 91.411.5 - 78.215.5 %   Platelets 393 150 - 400 K/uL   nRBC 0.0 0.0 - 0.2 %  Urinalysis, Complete w Microscopic     Status: Abnormal   Collection Time: 02/18/19  2:54 PM  Result Value Ref Range   Color, Urine YELLOW (A) YELLOW   APPearance CLEAR (A) CLEAR   Specific Gravity, Urine 1.012 1.005 - 1.030   pH 8.0 5.0 - 8.0   Glucose, UA 50 (A) NEGATIVE mg/dL   Hgb urine dipstick NEGATIVE  NEGATIVE   Bilirubin Urine NEGATIVE NEGATIVE   Ketones, ur NEGATIVE NEGATIVE mg/dL   Protein, ur 30 (A) NEGATIVE mg/dL   Nitrite NEGATIVE NEGATIVE   Leukocytes,Ua NEGATIVE NEGATIVE   RBC / HPF 0-5 0 - 5 RBC/hpf   WBC, UA 0-5 0 - 5 WBC/hpf   Bacteria, UA NONE SEEN NONE SEEN   Squamous Epithelial / LPF 0-5 0 - 5  Comprehensive metabolic panel     Status: Abnormal   Collection Time: 02/18/19  3:51 PM  Result Value Ref Range   Sodium 111 (LL) 135 - 145 mmol/L   Potassium 2.4 (LL) 3.5 - 5.1 mmol/L   Chloride 69 (  L) 98 - 111 mmol/L   CO2 30 22 - 32 mmol/L   Glucose, Bld 151 (H) 70 - 99 mg/dL   BUN 11 6 - 20 mg/dL   Creatinine, Ser 0.86 0.61 - 1.24 mg/dL   Calcium 8.8 (L) 8.9 - 10.3 mg/dL   Total Protein 7.4 6.5 - 8.1 g/dL   Albumin 2.7 (L) 3.5 - 5.0 g/dL   AST 67 (H) 15 - 41 U/L   ALT 93 (H) 0 - 44 U/L   Alkaline Phosphatase 82 38 - 126 U/L   Total Bilirubin 0.8 0.3 - 1.2 mg/dL   GFR calc non Af Amer >60 >60 mL/min   GFR calc Af Amer >60 >60 mL/min   Anion gap 12 5 - 15  Lipase, blood     Status: None   Collection Time: 02/18/19  3:51 PM  Result Value Ref Range   Lipase 31 11 - 51 U/L  Magnesium     Status: None   Collection Time: 02/18/19  3:51 PM  Result Value Ref Range   Magnesium 1.9 1.7 - 2.4 mg/dL  SARS Coronavirus 2 (CEPHEID- Performed in [place] hospital lab), Hosp Order     Status: Abnormal   Collection Time: 02/18/19  4:17 PM   Specimen: Nasopharyngeal Swab  Result Value Ref Range   SARS Coronavirus 2 POSITIVE (A) NEGATIVE   ____________________________________________  EKG My review and personal interpretation at Time: 14:39   Indication: weakness  Rate: 60  Rhythm: sinus Axis: normal Other: nonspecific t wave abn, no stemi ____________________________________________  RADIOLOGY I personally reviewed all radiographic images ordered to evaluate for the above acute complaints and reviewed radiology reports and findings.  These findings were personally  discussed with the patient.  Please see medical record for radiology report.   ____________________________________________   PROCEDURES  Procedure(s) performed:  .Critical Care Performed by: Merlyn Lot, MD Authorized by: Merlyn Lot, MD   Critical care provider statement:    Critical care time (minutes):  30   Critical care time was exclusive of:  Separately billable procedures and treating other patients   Critical care was time spent personally by me on the following activities:  Development of treatment plan with patient or surrogate, discussions with consultants, evaluation of patient's response to treatment, examination of patient, obtaining history from patient or surrogate, ordering and performing treatments and interventions, ordering and review of laboratory studies, ordering and review of radiographic studies, pulse oximetry, re-evaluation of patient's condition and review of old charts      Critical Care performed: yes ____________________________________________   INITIAL IMPRESSION / ASSESSMENT AND PLAN / ED COURSE  Pertinent labs & imaging results that were available during my care of the patient were reviewed by me and considered in my medical decision making (see chart for details).   DDX: dehydration, electrolyte abnormality, medication effect, COVID-19, sepsis, pneumonia  Aasim Restivo is a 63 y.o. who presents to the ED with symptoms as described above.  Patient with evidence of hyponatremia on initial lab work.  Brought immediately back to bed 10.  He is not hypoxic but is reporting generalized weakness.  Nothing to suggest seizure.  Will be given IV fluids.  Blood will be sent for the but differential.  Patient will require hospitalization.  We will continue IV fluid resuscitation for his acute hyponatremia.  Clinical Course as of Feb 17 1909  Wed Feb 18, 2019  1629 Repeat CMP consistent with hyponatremia his sodium 111 potassium 2.4.  Will add  on mag.  Will replete with IV fluids and potassium.  Will repeat COVID.  Have suspicion secondary to poor oral intake but patient also taking chlorthalidone.   [PR]  1641 Chest x-ray shows evidence of bilateral pulmonary infiltrates consistent with COVID-19 multifocal pneumonia.  Scribes a he is not septic at this time no hypoxia.   [PR]    Clinical Course User Index [PR] Willy Eddyobinson, Izeah Vossler, MD    The patient was evaluated in Emergency Department today for the symptoms described in the history of present illness. He/she was evaluated in the context of the global COVID-19 pandemic, which necessitated consideration that the patient might be at risk for infection with the SARS-CoV-2 virus that causes COVID-19. Institutional protocols and algorithms that pertain to the evaluation of patients at risk for COVID-19 are in a state of rapid change based on information released by regulatory bodies including the CDC and federal and state organizations. These policies and algorithms were followed during the patient's care in the ED.  As part of my medical decision making, I reviewed the following data within the electronic MEDICAL RECORD NUMBER Nursing notes reviewed and incorporated, Labs reviewed, notes from prior ED visits and [place] Controlled Substance Database   ____________________________________________   FINAL CLINICAL IMPRESSION(S) / ED DIAGNOSES  Final diagnoses:  Weakness  Acute hyponatremia  COVID-19 virus infection      NEW MEDICATIONS STARTED DURING THIS VISIT:  New Prescriptions   No medications on file     Note:  This document was prepared using Dragon voice recognition software and may include unintentional dictation errors.    Willy Eddyobinson, Nga Rabon, MD 02/18/19 1910

## 2019-02-18 NOTE — ED Notes (Signed)
ED tech in with patient assisting to restroom.

## 2019-02-18 NOTE — ED Notes (Signed)
ED TO INPATIENT HANDOFF REPORT  ED Nurse Name and Phone #: Cala BradfordKimberly [phone]  S Name/Age/Gender Fredda HammedNazario Atkison 63 y.o. male Room/Bed: ED10A/ED10A  Code Status   Code Status: Not on file  Home/SNF/Other Home Patient oriented to: self, place, time and situation Is this baseline? Yes   Triage Complete: Triage complete  Chief Complaint weakness diarrhea  Triage Note Pt states he was dx with covid 6/8 and has been fever free for the past 4 days but is having increased weakness with nausea, having diarrhea and is concerned he is dehydrated.   Allergies No Known Allergies  Level of Care/Admitting Diagnosis ED Disposition    ED Disposition Condition Comment   Transfer to Another Facility  The patient appears reasonably stabilized for transfer considering the current resources, flow, and capabilities available in the ED at this time, and I doubt any other [facility] requiring further screening and/or treatment in the ED prior to transfer is p resent.       B Medical/Surgery History Past Medical History:  Diagnosis Date  . Anemia   . Diabetes mellitus without complication (HCC)   . Gastritis   . H. pylori infection   . Hyperlipidemia   . Hypertension   . Vitamin B12 deficiency    Past Surgical History:  Procedure Laterality Date  . CATARACT EXTRACTION W/PHACO Left 05/09/2017   Procedure: CATARACT EXTRACTION PHACO AND INTRAOCULAR LENS PLACEMENT (IOC);  Surgeon: Nevada CraneKing, Bradley Mark, MD;  Location: [facility] ORS;  Service: Ophthalmology;  Laterality: Left;  Lot # [id number] H US:   00:18.5 AP%:  10.9 CDE:    2.02  . CATARACT EXTRACTION W/PHACO Right 06/27/2017   Procedure: CATARACT EXTRACTION PHACO AND INTRAOCULAR LENS PLACEMENT (IOC)-RIGHT DIABETIC;  Surgeon: Nevada CraneKing, Bradley Mark, MD;  Location: [facility] ORS;  Service: Ophthalmology;  Laterality: Right;  US 00:26.9 AP% 6.7 CDE 1.78 Fluid Pack lot #v [id number]  . COLONOSCOPY    . COLONOSCOPY WITH PROPOFOL N/A 09/16/2017   Procedure: COLONOSCOPY  WITH PROPOFOL;  Surgeon: Christena DeemSkulskie, Martin U, MD;  [facility];  Service: Endoscopy;  Laterality: N/A;  . EYE SURGERY       A IV Location/Drains/Wounds Patient Lines/Drains/Airways Status   Active Line/Drains/Airways    Name:   Placement date:   Placement time:   Site:   Days:   Peripheral IV 02/18/19 Right Antecubital   02/18/19    1548    Antecubital   less than 1   Incision (Closed) 05/09/17 Eye Left   05/09/17    0913     650   Incision (Closed) 06/27/17 Eye Right   06/27/17    0704     601          Intake/Output Last 24 hours No intake or output data in the 24 hours ending 02/18/19 2120  Labs/Imaging Results for orders placed or performed during the hospital encounter of 02/18/19 (from the past 48 hour(s))  Basic metabolic panel     Status: Abnormal   Collection Time: 02/18/19  2:54 PM  Result Value Ref Range   Sodium 110 (LL) 135 - 145 mmol/L    Comment: CRITICAL RESULT CALLED TO, READ BACK BY AND VERIFIED WITH AMY COHEN @1534  02/18/19 MJU    Potassium 2.4 (LL) 3.5 - 5.1 mmol/L    Comment: CRITICAL RESULT CALLED TO, READ BACK BY AND VERIFIED WITH AMY COHEN @1534  02/18/19 MJU    Chloride 67 (L) 98 - 111 mmol/L   CO2 28 22 - 32 mmol/L   Glucose,  Bld 165 (H) 70 - 99 mg/dL   BUN 11 6 - 20 mg/dL   Creatinine, Ser 1.470.69 0.61 - 1.24 mg/dL   Calcium 8.5 (L) 8.9 - 10.3 mg/dL   GFR calc non Af Amer >60 >60 mL/min   GFR calc Af Amer >60 >60 mL/min   Anion gap 15 5 - 15    Comment: Performed at [facility], [address] [place], [place] [address]  CBC     Status: Abnormal   Collection Time: 02/18/19  2:54 PM  Result Value Ref Range   WBC 8.1 4.0 - 10.5 K/uL   RBC 3.97 (L) 4.22 - 5.81 MIL/uL   Hemoglobin 11.1 (L) 13.0 - 17.0 g/dL   HCT 21.330.1 (L) 08.639.0 - 57.852.0 %   MCV 75.8 (L) 80.0 - 100.0 fL   MCH 28.0 26.0 - 34.0 pg   MCHC 36.9 (H) 30.0 - 36.0 g/dL   RDW 46.912.4 62.911.5 - 52.815.5 %   Platelets 393 150 - 400 K/uL   nRBC 0.0 0.0 - 0.2 %    Comment: Performed  at [facility], [address] [place], [place] [address]  Urinalysis, Complete w Microscopic     Status: Abnormal   Collection Time: 02/18/19  2:54 PM  Result Value Ref Range   Color, Urine YELLOW (A) YELLOW   APPearance CLEAR (A) CLEAR   Specific Gravity, Urine 1.012 1.005 - 1.030   pH 8.0 5.0 - 8.0   Glucose, UA 50 (A) NEGATIVE mg/dL   Hgb urine dipstick NEGATIVE NEGATIVE   Bilirubin Urine NEGATIVE NEGATIVE   Ketones, ur NEGATIVE NEGATIVE mg/dL   Protein, ur 30 (A) NEGATIVE mg/dL   Nitrite NEGATIVE NEGATIVE   Leukocytes,Ua NEGATIVE NEGATIVE   RBC / HPF 0-5 0 - 5 RBC/hpf   WBC, UA 0-5 0 - 5 WBC/hpf   Bacteria, UA NONE SEEN NONE SEEN   Squamous Epithelial / LPF 0-5 0 - 5    Comment: Performed at [facility], [address] [place], [place] [address]  Comprehensive metabolic panel     Status: Abnormal   Collection Time: 02/18/19  3:51 PM  Result Value Ref Range   Sodium 111 (LL) 135 - 145 mmol/L    Comment: CRITICAL RESULT CALLED TO, READ BACK BY AND VERIFIED WITH LAURA CATES @1620  02/18/19 MJU    Potassium 2.4 (LL) 3.5 - 5.1 mmol/L    Comment: CRITICAL RESULT CALLED TO, READ BACK BY AND VERIFIED WITH LAURA CATES @1620  02/18/19 MJU    Chloride 69 (L) 98 - 111 mmol/L   CO2 30 22 - 32 mmol/L   Glucose, Bld 151 (H) 70 - 99 mg/dL   BUN 11 6 - 20 mg/dL   Creatinine, Ser 7.250.86 0.61 - 1.24 mg/dL   Calcium 8.8 (L) 8.9 - 10.3 mg/dL   Total Protein 7.4 6.5 - 8.1 g/dL   Albumin 2.7 (L) 3.5 - 5.0 g/dL   AST 67 (H) 15 - 41 U/L   ALT 93 (H) 0 - 44 U/L   Alkaline Phosphatase 82 38 - 126 U/L   Total Bilirubin 0.8 0.3 - 1.2 mg/dL   GFR calc non Af Amer >60 >60 mL/min   GFR calc Af Amer >60 >60 mL/min   Anion gap 12 5 - 15    Comment: Performed at [facility], [address] [place], [place] [address]  Lipase, blood     Status: None   Collection Time: 02/18/19  3:51 PM  Result Value Ref Range   Lipase 31 11 - 51 U/L    Comment: Performed at [facility], [address] [place], [place] [address]  Magnesium     Status: None   Collection Time: 02/18/19  3:51 PM  Result Value Ref Range   Magnesium 1.9 1.7 - 2.4 mg/dL    Comment: Performed at [facility], [address] [place], [place] [address]  SARS Coronavirus 2 (CEPHEID- Performed in [facility] hospital lab), Hosp Order     Status: Abnormal   Collection Time: 02/18/19  4:17 PM   Specimen: Nasopharyngeal Swab  Result Value Ref Range   SARS Coronavirus 2 POSITIVE (A) NEGATIVE    Comment: RESULT CALLED TO, READ BACK BY AND VERIFIED WITH: SHANNON MARTIN AT 1712 ON 02/18/2019 [facility]. (NOTE) If result is NEGATIVE SARS-CoV-2 target nucleic acids are NOT DETECTED. The SARS-CoV-2 RNA is generally detectable in upper and lower  respiratory specimens during the acute phase of infection. The lowest  concentration of SARS-CoV-2 viral copies this assay can detect is 250  copies / mL. A negative result does not preclude SARS-CoV-2 infection  and should not be used as the sole basis for treatment or other  patient management decisions.  A negative result may occur with  improper specimen collection / handling, submission of specimen other  than nasopharyngeal swab, presence of viral mutation(s) within the  areas targeted by this assay, and inadequate number of viral copies  (<250 copies / mL). A negative result must be combined with clinical  observations, patient history, and epidemiological information. If result is POSITIVE SARS-CoV-2 target nucleic acids are DETECTE D. The SARS-CoV-2 RNA is generally detectable in upper and lower  respiratory specimens during the acute phase of infection.  Positive  results are indicative of active infection with SARS-CoV-2.  Clinical  correlation with patient history and other diagnostic information is  necessary to determine patient infection status.  Positive results do  not rule out bacterial infection or co-infection with other  viruses. If result is PRESUMPTIVE POSTIVE SARS-CoV-2 nucleic acids MAY BE PRESENT.   A presumptive positive result was obtained on the submitted specimen  and confirmed on repeat testing.  While 2019 novel coronavirus  (SARS-CoV-2) nucleic acids may be present in the submitted sample  additional confirmatory testing may be necessary for epidemiological  and / or clinical management purposes  to differentiate between  SARS-CoV-2 and other Sarbecovirus currently known to infect humans.  If clinically indicated additional testing with an alternate test  methodology (LAB745 3) is advised. The SARS-CoV-2 RNA is generally  detectable in upper and lower respiratory specimens during the acute  phase of infection. The expected result is Negative. Fact Sheet for Patients:  BoilerBrush.com.cyhttps://www.fda.gov/media/136312/download Fact Sheet for Healthcare Providers: https://pope.com/https://www.fda.gov/media/136313/download This test is not yet approved or cleared by the [place] FDA and has been authorized for detection and/or diagnosis of SARS-CoV-2 by FDA under an Emergency Use Authorization (EUA).  This EUA will remain in effect (meaning this test can be used) for the duration of the COVID-19 declaration under Section 564(b)(1) of the Act, 21 U.S.C. section 360bbb-3(b)(1), unless the authorization is terminated or revoked sooner. Performed at [facility], [address] [place], [place] [address]    Dg Chest Portable 1 View  Result Date: 02/18/2019 CLINICAL DATA:  Diagnosed with COVID-19 02/09/2019 who has been fever free past 4 days now with increased weakness and nausea. Diarrhea. EXAM: PORTABLE CHEST 1 VIEW COMPARISON:  None.  FINDINGS: Lungs are adequately inflated demonstrate moderate peripheral airspace opacification over the predominant mid to lower lungs. No effusion. Cardiomediastinal silhouette and remainder the exam is unremarkable. IMPRESSION: Peripheral mid to lower lung predominant airspace  process typical for multifocal pneumonia due to coronavirus in this patient who is known COVID-19 positive. Electronically Signed   By: Marin Olp M.D.   On: 02/18/2019 16:44    Pending Labs Unresulted Labs (From admission, onward)   None      Vitals/Pain Today's Vitals   02/18/19 1945 02/18/19 2000 02/18/19 2011 02/18/19 2030  BP:  (!) 149/78  (!) 141/54  Pulse: (!) 59 74  67  Resp: 13 18  15   Temp:   98.3 F (36.8 C)   TempSrc:   Oral   SpO2: 99% 97%  97%  Weight:      Height:      PainSc:        Isolation Precautions Droplet and Contact precautions  Medications Medications  sodium chloride flush (NS) 0.9 % injection 3 mL (3 mLs Intravenous Given 02/18/19 1548)  sodium chloride 0.9 % bolus 1,000 mL (0 mLs Intravenous Stopped 02/18/19 1900)  potassium chloride SA (K-DUR) CR tablet 40 mEq (40 mEq Oral Given 02/18/19 1648)  0.9 % NaCl with KCl 20 mEq/ L  infusion ( Intravenous New Bag/Given 02/18/19 2010)    Mobility walks Low fall risk   Focused Assessments Covid 19   R Recommendations: See Admitting Provider Note  Report given to:   Additional Notes:

## 2019-02-18 NOTE — ED Notes (Signed)
[facility]  CALLED  FOR  TRANSFER 

## 2019-02-18 NOTE — ED Triage Notes (Signed)
Pt states he was dx with covid 6/8 and has been fever free for the past 4 days but is having increased weakness with nausea, having diarrhea and is concerned he is dehydrated.

## 2019-02-18 NOTE — ED Notes (Signed)
Assisted pt with getting to the toilet. Pt voided.

## 2019-02-18 NOTE — ED Notes (Signed)
Sent rainbow,gray on ice, and 1 set of blood cultures.

## 2019-02-19 ENCOUNTER — Inpatient Hospital Stay (HOSPITAL_COMMUNITY)
Admission: AD | Admit: 2019-02-19 | Discharge: 2019-02-21 | DRG: 640 | Disposition: A | Discharge status: Home / Self Care | Payer: Commercial Managed Care - PPO | Source: Other Acute Inpatient Hospital | Attending: Internal Medicine | Admitting: Internal Medicine

## 2019-02-19 ENCOUNTER — Encounter (HOSPITAL_COMMUNITY): Payer: Self-pay | Admitting: *Deleted

## 2019-02-19 ENCOUNTER — Other Ambulatory Visit: Payer: Self-pay

## 2019-02-19 DIAGNOSIS — K529 Noninfective gastroenteritis and colitis, unspecified: Secondary | ICD-10-CM | POA: Diagnosis present

## 2019-02-19 DIAGNOSIS — Z9842 Cataract extraction status, left eye: Secondary | ICD-10-CM

## 2019-02-19 DIAGNOSIS — Z7984 Long term (current) use of oral hypoglycemic drugs: Secondary | ICD-10-CM

## 2019-02-19 DIAGNOSIS — Z79899 Other long term (current) drug therapy: Secondary | ICD-10-CM | POA: Diagnosis not present

## 2019-02-19 DIAGNOSIS — E1169 Type 2 diabetes mellitus with other specified complication: Secondary | ICD-10-CM | POA: Diagnosis present

## 2019-02-19 DIAGNOSIS — Z9841 Cataract extraction status, right eye: Secondary | ICD-10-CM

## 2019-02-19 DIAGNOSIS — E871 Hypo-osmolality and hyponatremia: Principal | ICD-10-CM

## 2019-02-19 DIAGNOSIS — E860 Dehydration: Secondary | ICD-10-CM | POA: Diagnosis present

## 2019-02-19 DIAGNOSIS — U071 COVID-19: Secondary | ICD-10-CM | POA: Diagnosis present

## 2019-02-19 DIAGNOSIS — E876 Hypokalemia: Secondary | ICD-10-CM | POA: Diagnosis not present

## 2019-02-19 DIAGNOSIS — Z961 Presence of intraocular lens: Secondary | ICD-10-CM | POA: Diagnosis not present

## 2019-02-19 DIAGNOSIS — E785 Hyperlipidemia, unspecified: Secondary | ICD-10-CM | POA: Diagnosis present

## 2019-02-19 DIAGNOSIS — I10 Essential (primary) hypertension: Secondary | ICD-10-CM | POA: Diagnosis not present

## 2019-02-19 LAB — BASIC METABOLIC PANEL
Anion gap: 14 (ref 5–15)
Anion gap: 11 (ref 5–15)
Anion gap: 12 (ref 5–15)
BUN: 9 mg/dL (ref 6–20)
BUN: 10 mg/dL (ref 6–20)
BUN: 9 mg/dL (ref 6–20)
CO2: 26 mmol/L (ref 22–32)
CO2: 26 mmol/L (ref 22–32)
CO2: 26 mmol/L (ref 22–32)
Calcium: 8.6 mg/dL — ABNORMAL LOW (ref 8.9–10.3)
Calcium: 8.9 mg/dL (ref 8.9–10.3)
Calcium: 9 mg/dL (ref 8.9–10.3)
Chloride: 80 mmol/L — ABNORMAL LOW (ref 98–111)
Chloride: 83 mmol/L — ABNORMAL LOW (ref 98–111)
Chloride: 83 mmol/L — ABNORMAL LOW (ref 98–111)
Creatinine, Ser: 0.67 mg/dL (ref 0.61–1.24)
Creatinine, Ser: 0.67 mg/dL (ref 0.61–1.24)
Creatinine, Ser: 0.73 mg/dL (ref 0.61–1.24)
GFR calc Af Amer: >60 mL/min (ref >60)
GFR calc Af Amer: >60 mL/min (ref >60)
GFR calc Af Amer: >60 mL/min (ref >60)
GFR calc non Af Amer: >60 mL/min (ref >60)
GFR calc non Af Amer: >60 mL/min (ref >60)
GFR calc non Af Amer: >60 mL/min (ref >60)
Glucose, Bld: 116 mg/dL — ABNORMAL HIGH (ref 70–99)
Glucose, Bld: 145 mg/dL — ABNORMAL HIGH (ref 70–99)
Glucose, Bld: 218 mg/dL — ABNORMAL HIGH (ref 70–99)
Potassium: 2.9 mmol/L — ABNORMAL LOW (ref 3.5–5.1)
Potassium: 3.2 mmol/L — ABNORMAL LOW (ref 3.5–5.1)
Potassium: 3.4 mmol/L — ABNORMAL LOW (ref 3.5–5.1)
Sodium: 120 mmol/L — ABNORMAL LOW (ref 135–145)
Sodium: 120 mmol/L — ABNORMAL LOW (ref 135–145)
Sodium: 121 mmol/L — ABNORMAL LOW (ref 135–145)

## 2019-02-19 LAB — HIV ANTIBODY (ROUTINE TESTING W REFLEX): HIV Screen 4th Generation wRfx: NONREACTIVE

## 2019-02-19 LAB — MAGNESIUM: Magnesium: 2 mg/dL (ref 1.7–2.4)

## 2019-02-19 LAB — C-REACTIVE PROTEIN: CRP: 13.7 mg/dL — ABNORMAL HIGH (ref <1.0)

## 2019-02-19 LAB — GLUCOSE, CAPILLARY
Glucose-Capillary: 127 mg/dL — ABNORMAL HIGH (ref 70–99)
Glucose-Capillary: 104 mg/dL — ABNORMAL HIGH (ref 70–99)
Glucose-Capillary: 157 mg/dL — ABNORMAL HIGH (ref 70–99)
Glucose-Capillary: 193 mg/dL — ABNORMAL HIGH (ref 70–99)

## 2019-02-19 LAB — HEMOGLOBIN A1C
Hgb A1c MFr Bld: 7.6 % — ABNORMAL HIGH (ref 4.8–5.6)
Mean Plasma Glucose: 171.42 mg/dL

## 2019-02-19 LAB — ABO/RH: ABO/RH(D): A POS

## 2019-02-19 LAB — LACTATE DEHYDROGENASE: LDH: 219 U/L — ABNORMAL HIGH (ref 98–192)

## 2019-02-19 LAB — PHOSPHORUS: Phosphorus: 1.3 mg/dL — ABNORMAL LOW (ref 2.5–4.6)

## 2019-02-19 LAB — FERRITIN: Ferritin: 898 ng/mL — ABNORMAL HIGH (ref 24–336)

## 2019-02-19 LAB — D-DIMER, QUANTITATIVE: D-Dimer, Quant: 1.07 ug/mL-FEU — ABNORMAL HIGH (ref 0.00–0.50)

## 2019-02-19 MED ORDER — SODIUM CHLORIDE 0.9 % IV SOLN
INTRAVENOUS | Status: DC
  Administered 2019-02-19: 03:00:00 via INTRAVENOUS
  Filled 2019-02-19 (×2): qty 1000

## 2019-02-19 MED ORDER — POTASSIUM PHOSPHATES 15 MMOLE/5ML IV SOLN
30.0000 mmol | Freq: Once | INTRAVENOUS | Status: AC
  Administered 2019-02-19: 30 mmol via INTRAVENOUS
  Filled 2019-02-19: qty 10

## 2019-02-19 MED ORDER — SODIUM CHLORIDE 0.9 % IV SOLN: INTRAVENOUS | Status: DC

## 2019-02-19 MED ORDER — ENOXAPARIN SODIUM 40 MG/0.4ML ~~LOC~~ SOLN
40.0000 mg | Freq: Every day | SUBCUTANEOUS | Status: DC
  Administered 2019-02-19 – 2019-02-21 (×3): 40 mg via SUBCUTANEOUS
  Filled 2019-02-19 (×3): qty 0.4

## 2019-02-19 MED ORDER — INSULIN ASPART 100 UNIT/ML ~~LOC~~ SOLN
0.0000 [IU] | Freq: Three times a day (TID) | SUBCUTANEOUS | Status: DC
  Administered 2019-02-19: 1 [IU] via SUBCUTANEOUS
  Administered 2019-02-19 – 2019-02-20 (×3): 2 [IU] via SUBCUTANEOUS
  Administered 2019-02-20: 12:00:00 3 [IU] via SUBCUTANEOUS
  Administered 2019-02-20 – 2019-02-21 (×2): 2 [IU] via SUBCUTANEOUS
  Administered 2019-02-21: 13:00:00 3 [IU] via SUBCUTANEOUS
  Administered 2019-02-21: 2 [IU] via SUBCUTANEOUS

## 2019-02-19 MED ORDER — POTASSIUM CHLORIDE 10 MEQ/100ML IV SOLN
10.0000 meq | INTRAVENOUS | Status: AC
  Filled 2019-02-19: qty 100

## 2019-02-19 MED ORDER — POTASSIUM CHLORIDE CRYS ER 20 MEQ PO TBCR
40.0000 meq | EXTENDED_RELEASE_TABLET | Freq: Two times a day (BID) | ORAL | Status: DC
  Administered 2019-02-19 (×3): 40 meq via ORAL
  Filled 2019-02-19 (×3): qty 2

## 2019-02-19 MED ORDER — INSULIN ASPART 100 UNIT/ML ~~LOC~~ SOLN
0.0000 [IU] | Freq: Every day | SUBCUTANEOUS | Status: DC
  Administered 2019-02-19 – 2019-02-20 (×2): 2 [IU] via SUBCUTANEOUS

## 2019-02-19 MED ORDER — ACETAMINOPHEN 325 MG PO TABS: 650.0000 mg | ORAL_TABLET | Freq: Four times a day (QID) | ORAL | Status: DC | PRN

## 2019-02-19 MED ORDER — ATENOLOL 50 MG PO TABS
50.0000 mg | ORAL_TABLET | Freq: Every day | ORAL | Status: DC
  Administered 2019-02-19 – 2019-02-21 (×3): 50 mg via ORAL
  Filled 2019-02-19 (×4): qty 1

## 2019-02-19 MED ORDER — LISINOPRIL 20 MG PO TABS
40.0000 mg | ORAL_TABLET | Freq: Every day | ORAL | Status: DC
  Administered 2019-02-19 – 2019-02-21 (×3): 40 mg via ORAL
  Filled 2019-02-19 (×4): qty 2

## 2019-02-19 NOTE — Progress Notes (Signed)
Called and spoke with patient's daughter, Ennis Forts.  Gave update and all questions answered.  Earleen Reaper RN

## 2019-02-19 NOTE — Progress Notes (Signed)
New Admission Note:   Arrival Method:   From [facility] ED via [place] Mental Orientation:  A & O x 4 - Spanish Speaking only - needs interpreter Telemetry:   NSR Assessment: Completed Skin:  Intact IV:  Rt AC Pain:   Denies Tubes:  N/A Safety Measures: Safety Fall Prevention Plan has been given, discussed and signed Admission: Completed [address] [place] Orientation: Patient has been orientated to the room, unit and staff.  Family:  Ennis Forts, daughter, called and updated on father's condition  Orders have been reviewed and implemented. Will continue to monitor the patient. Call light has been placed within reach and bed alarm has been activated.   Earleen Reaper RN- BC, [employer]

## 2019-02-19 NOTE — Progress Notes (Signed)
PROGRESS NOTE                                                                                                                                                                                                             Patient Demographics:    Greg Stevenson, is a 63 y.o. male, DOB - 1956/07/07, ZOX:[id number]  Admit date - 02/19/2019   Admitting Physician Dorcas CarrowKuber Ghimire, MD  Outpatient Primary MD for the patient is Mickey Farberhies, David, MD  LOS - 0   No chief complaint on file.      Brief Narrative    This is a no charge note as patient admitted earlier today by Dr. Jerral RalphGhimire, chart, imaging, labs were reviewed   Subjective:    Greg HammedNazario Vanpatten today denies nausea, reports one episode of diarrhea this morning   Assessment  & Plan :    Principal Problem:   Dehydration with hyponatremia Active Problems:   COVID-19 virus infection   Type 2 diabetes mellitus with hyperlipidemia (HCC)   Hypokalemia   Essential hypertension    Severe hyponatremia with dehydration: Clinical picture consistent with clinical dehydration along with the use of chlorthalidone and may have been contributed by Buspirone.   -Presents with sodium of 110, repeat is 120, I have stopped his IV normal saline, is want to avoid rapid overcorrection , hold on giving any further fluids beside potassium phosphate (which is in sterile water )  Phosphatemia  -repleted , will recheck in a.m.    Hypokalemia: Severe.  Replace aggressively IV and oral.  Recheck levels.  Magnesium was normal.  COVID-19 virus infection: With no obvious respiratory symptoms.  Will monitor.  Causing gastroenteritis and food loss.  Type 2 diabetes: On oral hypoglycemics at home.  Hold and keep on sliding scale insulin.  Fairly controlled.  Will allow diabetic diet.  Hypertension: Blood pressures well controlled.  Resume atenolol and lisinopril.  Hold chlorthalidone. COVID-19 Labs  Recent Labs    02/19/19  0640 02/19/19 1245  DDIMER 1.07*  --   FERRITIN 898*  --   LDH 219*  --   CRP  --  13.7*    Lab Results  Component Value Date   SARSCOV2NAA POSITIVE (A) 02/18/2019     Code Status : Full  Family Communication  : D/W daughter Via phone  Disposition Plan  :  home when stable  Barriers For Discharge : remains on IV fluid and electrolytes replacment  Consults  :  none  Procedures  : None  DVT Prophylaxis  :  [place] lovenox  Lab Results  Component Value Date   PLT 393 02/18/2019    Antibiotics  :    Anti-infectives (From admission, onward)   None        Objective:   Vitals:   02/19/19 0600 02/19/19 0700 02/19/19 0800 02/19/19 1200  BP: 123/72 132/69 (!) 143/71   Pulse: 60  71 71  Resp: 11 14 13 17   Temp:   99.7 F (37.6 C) 99.4 F (37.4 C)  TempSrc:      SpO2: 95%  97% 93%  Weight:      Height:        Wt Readings from Last 3 Encounters:  02/19/19 91.1 kg  02/18/19 91.2 kg  01/24/18 92.5 kg     Intake/Output Summary (Last 24 hours) at 02/19/2019 1429 Last data filed at 02/19/2019 0800 Gross per 24 hour  Intake 472.6 ml  Output 975 ml  Net -502.4 ml     Physical Exam  Awake Alert, Oriented X 3, No new F.N deficits, Normal affect Symmetrical Chest wall movement, Good air movement bilaterally, CTAB RRR,No Gallops,Rubs or new Murmurs, No Parasternal Heave +ve B.Sounds, Abd Soft, No tenderness, - guarding or rigidity. No Cyanosis, Clubbing or edema, No new Rash or bruise      Data Review:    CBC Recent Labs  Lab 02/18/19 1454  WBC 8.1  HGB 11.1*  HCT 30.1*  PLT 393  MCV 75.8*  MCH 28.0  MCHC 36.9*  RDW 12.4    Chemistries  Recent Labs  Lab 02/18/19 1454 02/18/19 1551 02/19/19 0640 02/19/19 1245  NA 110* 111* 120* 120*  K 2.4* 2.4* 2.9* 3.2*  CL 67* 69* 80* 83*  CO2 28 30 26 26   GLUCOSE 165* 151* 116* 145*  BUN 11 11 9 10   CREATININE 0.69 0.86 0.67 0.67  CALCIUM 8.5* 8.8* 8.6* 8.9  MG  --  1.9  --  2.0  AST  --  67*  --    --   ALT  --  93*  --   --   ALKPHOS  --  82  --   --   BILITOT  --  0.8  --   --    ------------------------------------------------------------------------------------------------------------------ No results for input(s): CHOL, HDL, LDLCALC, TRIG, CHOLHDL, LDLDIRECT in the last 72 hours.  Lab Results  Component Value Date   HGBA1C 7.6 (H) 02/19/2019   ------------------------------------------------------------------------------------------------------------------ No results for input(s): TSH, T4TOTAL, T3FREE, THYROIDAB in the last 72 hours.  Invalid input(s): FREET3 ------------------------------------------------------------------------------------------------------------------ Recent Labs    02/19/19 0640  FERRITIN 898*    Coagulation profile No results for input(s): INR, PROTIME in the last 168 hours.  Recent Labs    02/19/19 0640  DDIMER 1.07*    Cardiac Enzymes No results for input(s): CKMB, TROPONINI, MYOGLOBIN in the last 168 hours.  Invalid input(s): CK ------------------------------------------------------------------------------------------------------------------ No results found for: BNP  Inpatient Medications  Scheduled Meds: . atenolol  50 mg Oral Daily  . enoxaparin (LOVENOX) injection  40 mg Subcutaneous Daily  . insulin aspart  0-5 Units Subcutaneous QHS  . insulin aspart  0-9 Units Subcutaneous TID WC  . lisinopril  40 mg Oral Daily  . potassium chloride  40 mEq Oral BID   Continuous Infusions: . potassium PHOSPHATE IVPB (in mmol)  PRN Meds:.acetaminophen  Micro Results Recent Results (from the past 240 hour(s))  SARS Coronavirus 2 (CEPHEID- Performed in [place] hospital lab), Hosp Order     Status: Abnormal   Collection Time: 02/18/19  4:17 PM   Specimen: Nasopharyngeal Swab  Result Value Ref Range Status   SARS Coronavirus 2 POSITIVE (A) NEGATIVE Final    Comment: RESULT CALLED TO, READ BACK BY AND VERIFIED WITH: SHANNON  MARTIN AT 1712 ON 02/18/2019 [place]. (NOTE) If result is NEGATIVE SARS-CoV-2 target nucleic acids are NOT DETECTED. The SARS-CoV-2 RNA is generally detectable in upper and lower  respiratory specimens during the acute phase of infection. The lowest  concentration of SARS-CoV-2 viral copies this assay can detect is 250  copies / mL. A negative result does not preclude SARS-CoV-2 infection  and should not be used as the sole basis for treatment or other  patient management decisions.  A negative result may occur with  improper specimen collection / handling, submission of specimen other  than nasopharyngeal swab, presence of viral mutation(s) within the  areas targeted by this assay, and inadequate number of viral copies  (<250 copies / mL). A negative result must be combined with clinical  observations, patient history, and epidemiological information. If result is POSITIVE SARS-CoV-2 target nucleic acids are DETECTE D. The SARS-CoV-2 RNA is generally detectable in upper and lower  respiratory specimens during the acute phase of infection.  Positive  results are indicative of active infection with SARS-CoV-2.  Clinical  correlation with patient history and other diagnostic information is  necessary to determine patient infection status.  Positive results do  not rule out bacterial infection or co-infection with other viruses. If result is PRESUMPTIVE POSTIVE SARS-CoV-2 nucleic acids MAY BE PRESENT.   A presumptive positive result was obtained on the submitted specimen  and confirmed on repeat testing.  While 2019 novel coronavirus  (SARS-CoV-2) nucleic acids may be present in the submitted sample  additional confirmatory testing may be necessary for epidemiological  and / or clinical management purposes  to differentiate between  SARS-CoV-2 and other Sarbecovirus currently known to infect humans.  If clinically indicated additional testing with an alternate test  methodology (LAB745 3)  is advised. The SARS-CoV-2 RNA is generally  detectable in upper and lower respiratory specimens during the acute  phase of infection. The expected result is Negative. Fact Sheet for Patients:  StrictlyIdeas.no Fact Sheet for Healthcare Providers: BankingDealers.co.za This test is not yet approved or cleared by the [place] FDA and has been authorized for detection and/or diagnosis of SARS-CoV-2 by FDA under an Emergency Use Authorization (EUA).  This EUA will remain in effect (meaning this test can be used) for the duration of the COVID-19 declaration under Section 564(b)(1) of the Act, 21 U.S.C. section 360bbb-3(b)(1), unless the authorization is terminated or revoked sooner. Performed at [facility], [address] [place], [place] [address]     Radiology Reports Dg Chest Portable 1 View  Result Date: 02/18/2019 CLINICAL DATA:  Diagnosed with COVID-19 02/09/2019 who has been fever free past 4 days now with increased weakness and nausea. Diarrhea. EXAM: PORTABLE CHEST 1 VIEW COMPARISON:  None. FINDINGS: Lungs are adequately inflated demonstrate moderate peripheral airspace opacification over the predominant mid to lower lungs. No effusion. Cardiomediastinal silhouette and remainder the exam is unremarkable. IMPRESSION: Peripheral mid to lower lung predominant airspace process typical for multifocal pneumonia due to coronavirus in this patient who is known COVID-19 positive. Electronically Signed   By:  Elberta Fortisaniel  Boyle M.D.   On: 02/18/2019 16:44    Time Spent in minutes  : No charge   Huey Bienenstockawood Dominyk Law M.D on 02/19/2019 at 2:29 PM  Between 7am to 7pm - Pager - [phone]  After 7pm go to [email or web address] - password [email or web address]  [facility] -  Office  [phone]

## 2019-02-19 NOTE — H&P (Signed)
History and Physical    Greg Hammedazario Ciolino ZOX:[id number] DOB: 01/27/1956 DOA: 02/19/2019  PCP: Mickey Farberhies, David, MD  Patient coming from: Home to [facility] emergency room  I have personally briefly reviewed patient's old medical records available.   Chief Complaint: Generalized weakness and nausea vomiting and diarrhea.  HPI: Greg Stevenson is a 63 y.o. male with medical history significant of type 2 diabetes on oral hypoglycemics, hypertension on chlorthalidone and lisinopril, who presented to the emergency room with generalized weakness, malaise, decreased oral intake, poor appetite, frequent watery stool since about Last 4 days.  Patient had fever and some nausea 10 days ago, he was tested positive for COVID-19.  His fever improved, however patient had frequent diarrhea 2-3 times a day, watery with poor appetite associated with extreme weakness and malaise.  Currently denies any fever .  Denies any shortness of breath. ED Course: Hemodynamically stable.  On room air.No respiratory symptoms.  Afebrile.  Sodium was 110, potassium 2.4, chloride 67, normal renal functions and normal magnesium.  Patient was given 1 L of isotonic saline and is started on normal saline and [facility] for management of hyponatremia and dehydration.  Review of Systems: As per HPI otherwise 10 point review of systems negative.    Past Medical History:  Diagnosis Date  . Anemia   . Diabetes mellitus without complication (HCC)   . Gastritis   . H. pylori infection   . Hyperlipidemia   . Hypertension   . Vitamin B12 deficiency     Past Surgical History:  Procedure Laterality Date  . CATARACT EXTRACTION W/PHACO Left 05/09/2017   Procedure: CATARACT EXTRACTION PHACO AND INTRAOCULAR LENS PLACEMENT (IOC);  Surgeon: Nevada CraneKing, Bradley Mark, MD;  Location: [facility] ORS;  Service: Ophthalmology;  Laterality: Left;  Lot # [id number] H US:   00:18.5 AP%:  10.9 CDE:    2.02  . CATARACT EXTRACTION W/PHACO Right  06/27/2017   Procedure: CATARACT EXTRACTION PHACO AND INTRAOCULAR LENS PLACEMENT (IOC)-RIGHT DIABETIC;  Surgeon: Nevada CraneKing, Bradley Mark, MD;  Location: [facility] ORS;  Service: Ophthalmology;  Laterality: Right;  US 00:26.9 AP% 6.7 CDE 1.78 Fluid Pack lot #[id number]  . COLONOSCOPY    . COLONOSCOPY WITH PROPOFOL N/A 09/16/2017   Procedure: COLONOSCOPY WITH PROPOFOL;  Surgeon: Christena DeemSkulskie, Martin U, MD;  Location[facility] ENDOSCOPY;  Service: Endoscopy;  Laterality: N/A;  . EYE SURGERY       reports that he has quit smoking. He has never used smokeless tobacco. He reports current alcohol use. He reports that he does not use drugs.  No Known Allergies  No family history on file.   Prior to Admission medications   Medication Sig Start Date End Date Taking? Authorizing Provider  atenolol (TENORMIN) 50 MG tablet Take 50 mg by mouth daily.    [provider]  busPIRone (BUSPAR) 15 MG tablet Take 5-15 mg by mouth 2 (two) times a day. 02/16/19 02/16/20  [provider]  chlorthalidone (HYGROTON) 25 MG tablet Take 25 mg by mouth daily.    [provider]  glimepiride (AMARYL) 2 MG tablet Take 2 mg by mouth daily with breakfast.    [provider]  lisinopril (PRINIVIL,ZESTRIL) 40 MG tablet Take 40 mg by mouth daily.    [provider]  lovastatin (MEVACOR) 40 MG tablet Take 40 mg by mouth at bedtime.    [provider]  metFORMIN (GLUCOPHAGE-XR) 500 MG 24 hr tablet Take 500 mg by mouth 2 (two) times daily.    [provider]    Physical Exam: There were no vitals filed for this visit.  Constitutional: NAD, calm, comfortable, on room air. There were no vitals filed for this visit. Eyes: PERRL, lids and conjunctivae normal ENMT: Mucous membranes are moist. Posterior pharynx clear of any exudate or lesions.Normal dentition.  Neck: normal, supple, no masses, no thyromegaly Respiratory: clear to auscultation bilaterally, no wheezing, no crackles.  Normal respiratory effort. No accessory muscle use.  Cardiovascular: Regular rate and rhythm, no murmurs / rubs / gallops. No extremity edema. 2+ pedal pulses. No carotid bruits.  Abdomen: no tenderness, no masses palpated. No hepatosplenomegaly. Bowel sounds positive.  Obese and pendulous.  No tenderness. Musculoskeletal: no clubbing / cyanosis. No joint deformity upper and lower extremities. Good ROM, no contractures. Normal muscle tone.  Skin: no rashes, lesions, ulcers. No induration Neurologic: CN 2-12 grossly intact. Sensation intact, DTR normal. Strength 5/5 in all 4.  Psychiatric: Normal judgment and insight. Alert and oriented x 3. Normal mood.     Labs on Admission: I have personally reviewed following labs and imaging studies  CBC: Recent Labs  Lab 02/18/19 1454  WBC 8.1  HGB 11.1*  HCT 30.1*  MCV 75.8*  PLT 393   Basic Metabolic Panel: Recent Labs  Lab 02/18/19 1454 02/18/19 1551  NA 110* 111*  K 2.4* 2.4*  CL 67* 69*  CO2 28 30  GLUCOSE 165* 151*  BUN 11 11  CREATININE 0.69 0.86  CALCIUM 8.5* 8.8*  MG  --  1.9   GFR: Estimated Creatinine Clearance: 93 mL/min (by C-G formula based on SCr of 0.86 mg/dL). Liver Function Tests: Recent Labs  Lab 02/18/19 1551  AST 67*  ALT 93*  ALKPHOS 82  BILITOT 0.8  PROT 7.4  ALBUMIN 2.7*   Recent Labs  Lab 02/18/19 1551  LIPASE 31   No results for input(s): AMMONIA in the last 168 hours. Coagulation Profile: No results for input(s): INR, PROTIME in the last 168 hours. Cardiac Enzymes: No results for input(s): CKTOTAL, CKMB, CKMBINDEX, TROPONINI in the last 168 hours. BNP (last 3 results) No results for input(s): PROBNP in the last 8760 hours. HbA1C: No results for input(s): HGBA1C in the last 72 hours. CBG: No results for input(s): GLUCAP in the last 168 hours. Lipid Profile: No results for input(s): CHOL, HDL, LDLCALC, TRIG, CHOLHDL, LDLDIRECT in the last 72 hours. Thyroid Function Tests: No results  for input(s): TSH, T4TOTAL, FREET4, T3FREE, THYROIDAB in the last 72 hours. Anemia Panel: No results for input(s): VITAMINB12, FOLATE, FERRITIN, TIBC, IRON, RETICCTPCT in the last 72 hours. Urine analysis:    Component Value Date/Time   COLORURINE YELLOW (A) 02/18/2019 1454   APPEARANCEUR CLEAR (A) 02/18/2019 1454   LABSPEC 1.012 02/18/2019 1454   PHURINE 8.0 02/18/2019 1454   GLUCOSEU 50 (A) 02/18/2019 1454   HGBUR NEGATIVE 02/18/2019 1454   BILIRUBINUR NEGATIVE 02/18/2019 1454   KETONESUR NEGATIVE 02/18/2019 1454   PROTEINUR 30 (A) 02/18/2019 1454   NITRITE NEGATIVE 02/18/2019 1454   LEUKOCYTESUR NEGATIVE 02/18/2019 1454    Radiological Exams on Admission: Dg Chest Portable 1 View  Result Date: 02/18/2019 CLINICAL DATA:  Diagnosed with COVID-19 02/09/2019 who has been fever free past 4 days now with increased weakness and nausea. Diarrhea. EXAM: PORTABLE CHEST 1 VIEW COMPARISON:  None. FINDINGS: Lungs are adequately inflated demonstrate moderate peripheral airspace opacification over the predominant mid to lower lungs. No effusion. Cardiomediastinal silhouette and remainder the exam is unremarkable. IMPRESSION: Peripheral mid to lower lung  predominant airspace process typical for multifocal pneumonia due to coronavirus in this patient who is known COVID-19 positive. Electronically Signed   By: Marin Olp M.D.   On: 02/18/2019 16:44    EKG: Independently reviewed. Normal sinus rhythm.  No ST-T wave changes.  Assessment/Plan Principal Problem:   Dehydration with hyponatremia Active Problems:   COVID-19 virus infection   Type 2 diabetes mellitus with hyperlipidemia (HCC)   Hypokalemia   Essential hypertension     1.  Severe hyponatremia with dehydration: Clinical picture consistent with clinical dehydration along with the use of chlorthalidone and may have been contributed by Buspirone.  Patient is currently clinically stable with no evidence of neurological compromise.  Status post 1 L isotonic saline. We will continue maintenance isotonic saline, recheck sodium now.  Will recheck BMP every 6 hours in order to avoid overcorrection.  Correct 12 mEq in 24 hours.  Hold chlorthalidone.  Hold BuSpar.  2.  Hypokalemia: Severe.  Replace aggressively IV and oral.  Recheck levels.  Magnesium was normal.  3.  COVID-19 virus infection: With no obvious respiratory symptoms.  Will monitor.  Causing gastroenteritis and food loss.  4.  Type 2 diabetes: On oral hypoglycemics at home.  Hold and keep on sliding scale insulin.  Fairly controlled.  Will allow diabetic diet.  5.  Hypertension: Blood pressures well controlled.  Resume atenolol and lisinopril.  Hold chlorthalidone.    DVT prophylaxis: Lovenox subcu Code Status: Full code Family Communication: None Disposition Plan: Home after hospitalization Consults called: None Admission status: Inpatient telemetry   Barb Merino MD [facility] Pager [phone]  If 7PM-7AM, please contact night-coverage [email or web address] Password [email or web address]  02/19/2019, 2:02 AM

## 2019-02-20 DIAGNOSIS — E785 Hyperlipidemia, unspecified: Secondary | ICD-10-CM

## 2019-02-20 DIAGNOSIS — U071 COVID-19: Secondary | ICD-10-CM

## 2019-02-20 DIAGNOSIS — E876 Hypokalemia: Secondary | ICD-10-CM

## 2019-02-20 DIAGNOSIS — I10 Essential (primary) hypertension: Secondary | ICD-10-CM

## 2019-02-20 DIAGNOSIS — E1169 Type 2 diabetes mellitus with other specified complication: Secondary | ICD-10-CM

## 2019-02-20 LAB — COMPREHENSIVE METABOLIC PANEL
ALT: 214 U/L — ABNORMAL HIGH (ref 0–44)
AST: 146 U/L — ABNORMAL HIGH (ref 15–41)
Albumin: 2.4 g/dL — ABNORMAL LOW (ref 3.5–5.0)
Alkaline Phosphatase: 106 U/L (ref 38–126)
Anion gap: 11 (ref 5–15)
BUN: 9 mg/dL (ref 6–20)
CO2: 24 mmol/L (ref 22–32)
Calcium: 9 mg/dL (ref 8.9–10.3)
Chloride: 87 mmol/L — ABNORMAL LOW (ref 98–111)
Creatinine, Ser: 0.64 mg/dL (ref 0.61–1.24)
GFR calc Af Amer: >60 mL/min (ref >60)
GFR calc non Af Amer: >60 mL/min (ref >60)
Glucose, Bld: 168 mg/dL — ABNORMAL HIGH (ref 70–99)
Potassium: 3.6 mmol/L (ref 3.5–5.1)
Sodium: 122 mmol/L — ABNORMAL LOW (ref 135–145)
Total Bilirubin: 0.4 mg/dL (ref 0.3–1.2)
Total Protein: 6.5 g/dL (ref 6.5–8.1)

## 2019-02-20 LAB — BASIC METABOLIC PANEL
Anion gap: 10 (ref 5–15)
BUN: 12 mg/dL (ref 6–20)
CO2: 26 mmol/L (ref 22–32)
Calcium: 9.2 mg/dL (ref 8.9–10.3)
Chloride: 89 mmol/L — ABNORMAL LOW (ref 98–111)
Creatinine, Ser: 0.72 mg/dL (ref 0.61–1.24)
GFR calc Af Amer: >60 mL/min (ref >60)
GFR calc non Af Amer: >60 mL/min (ref >60)
Glucose, Bld: 173 mg/dL — ABNORMAL HIGH (ref 70–99)
Potassium: 3.6 mmol/L (ref 3.5–5.1)
Sodium: 125 mmol/L — ABNORMAL LOW (ref 135–145)

## 2019-02-20 LAB — CBC WITH DIFFERENTIAL/PLATELET
Abs Immature Granulocytes: 0.06 10*3/uL (ref 0.00–0.07)
Basophils Absolute: 0 10*3/uL (ref 0.0–0.1)
Basophils Relative: 1 %
Eosinophils Absolute: 0.2 10*3/uL (ref 0.0–0.5)
Eosinophils Relative: 4 %
HCT: 29.2 % — ABNORMAL LOW (ref 39.0–52.0)
Hemoglobin: 10.3 g/dL — ABNORMAL LOW (ref 13.0–17.0)
Immature Granulocytes: 1 %
Lymphocytes Relative: 19 %
Lymphs Abs: 1.2 10*3/uL (ref 0.7–4.0)
MCH: 28.6 pg (ref 26.0–34.0)
MCHC: 35.3 g/dL (ref 30.0–36.0)
MCV: 81.1 fL (ref 80.0–100.0)
Monocytes Absolute: 0.5 10*3/uL (ref 0.1–1.0)
Monocytes Relative: 7 %
Neutro Abs: 4.4 10*3/uL (ref 1.7–7.7)
Neutrophils Relative %: 68 %
Platelets: 448 10*3/uL — ABNORMAL HIGH (ref 150–400)
RBC: 3.6 MIL/uL — ABNORMAL LOW (ref 4.22–5.81)
RDW: 13.1 % (ref 11.5–15.5)
WBC: 6.3 10*3/uL (ref 4.0–10.5)
nRBC: 0 % (ref 0.0–0.2)

## 2019-02-20 LAB — MAGNESIUM: Magnesium: 2 mg/dL (ref 1.7–2.4)

## 2019-02-20 LAB — GLUCOSE, CAPILLARY
Glucose-Capillary: 173 mg/dL — ABNORMAL HIGH (ref 70–99)
Glucose-Capillary: 220 mg/dL — ABNORMAL HIGH (ref 70–99)
Glucose-Capillary: 165 mg/dL — ABNORMAL HIGH (ref 70–99)
Glucose-Capillary: 175 mg/dL — ABNORMAL HIGH (ref 70–99)

## 2019-02-20 LAB — PHOSPHORUS: Phosphorus: 2.3 mg/dL — ABNORMAL LOW (ref 2.5–4.6)

## 2019-02-20 MED ORDER — K PHOS MONO-SOD PHOS DI & MONO 155-852-130 MG PO TABS
250.0000 mg | ORAL_TABLET | Freq: Three times a day (TID) | ORAL | Status: DC
  Administered 2019-02-20 – 2019-02-21 (×5): 250 mg via ORAL
  Filled 2019-02-20 (×5): qty 1

## 2019-02-20 MED ORDER — POTASSIUM CHLORIDE CRYS ER 20 MEQ PO TBCR
40.0000 meq | EXTENDED_RELEASE_TABLET | Freq: Every day | ORAL | Status: DC
  Administered 2019-02-20 – 2019-02-21 (×2): 40 meq via ORAL
  Filled 2019-02-20 (×2): qty 2

## 2019-02-20 MED ORDER — SODIUM CHLORIDE 0.9 % IV SOLN
INTRAVENOUS | Status: DC
  Administered 2019-02-20: 09:00:00 via INTRAVENOUS

## 2019-02-20 NOTE — Progress Notes (Signed)
PROGRESS NOTE                                                                                                                                                                                                             Patient Demographics:    Greg Stevenson, is a 63 y.o. male, DOB - 06-18-1956, ZOX:[id number]  Admit date - 02/19/2019   Admitting Physician Dorcas CarrowKuber Ghimire, MD  Outpatient Primary MD for the patient is Mickey Farberhies, David, MD  LOS - 1   No chief complaint on file.      Brief Narrative     63 y.o. male with medical history significant of type 2 diabetes , hypertension who presented to the emergency room with generalized weakness, diarrhea, weakness, he tested positive for COVID-19, no hypoxia or shortness of breath, work-up significant for hyponatremia sodium of 110, hypokalemia potassium of 2.4, he was transferred to [facility] for further work-up .   Subjective:    Greg HammedNazario Lurry today denies nausea, reports one episode of diarrhea this morning   Assessment  & Plan :    Principal Problem:   Dehydration with hyponatremia Active Problems:   COVID-19 virus infection   Type 2 diabetes mellitus with hyperlipidemia (HCC)   Hypokalemia   Essential hypertension    Severe hyponatremia with dehydration: -Clinical picture consistent with clinical dehydration along with the use of chlorthalidone and may have been contributed by Buspirone.   -Hold chlorthalidone and buspirone -Sodium 110 on admission, will avoid rapid overcorrection, morning it is 121, will resume back on IV normal saline at 50 cc/h, will repeat BMP around 4 PM.  Phosphatemia  -Proving, started on Neutra-Phos   Hypokalemia:  -Repleted  COVID-19 virus infection: With no obvious respiratory symptoms.  Will monitor.  Causing gastroenteritis and food loss.  Type 2 diabetes: On oral hypoglycemics at home.  Hold and keep on sliding scale insulin.  Fairly controlled.  Will allow  diabetic diet.  Hypertension: Blood pressures well controlled.  Resume atenolol and lisinopril.  Hold chlorthalidone. COVID-19 Labs  Recent Labs    02/19/19 0640 02/19/19 1245  DDIMER 1.07*  --   FERRITIN 898*  --   LDH 219*  --   CRP  --  13.7*    Lab Results  Component Value Date   SARSCOV2NAA POSITIVE (A) 02/18/2019  Code Status : Full  Family Communication  : D/W daughter Via phone  Disposition Plan  : home when stable  Barriers For Discharge : remains on IV fluid and electrolytes replacment  Consults  :  none  Procedures  : None  DVT Prophylaxis  :  [place] lovenox  Lab Results  Component Value Date   PLT 448 (H) 02/20/2019    Antibiotics  :    Anti-infectives (From admission, onward)   None        Objective:   Vitals:   02/20/19 0448 02/20/19 0803 02/20/19 0830 02/20/19 1200  BP: 140/72  (!) 143/69 130/66  Pulse:   79 67  Resp:   15 11  Temp: 98.8 F (37.1 C) 98.3 F (36.8 C)    TempSrc: Oral Oral    SpO2:    96%  Weight:      Height:        Wt Readings from Last 3 Encounters:  02/19/19 91.1 kg  02/18/19 91.2 kg  01/24/18 92.5 kg     Intake/Output Summary (Last 24 hours) at 02/20/2019 1515 Last data filed at 02/20/2019 0803 Gross per 24 hour  Intake 400 ml  Output 1725 ml  Net -1325 ml     Physical Exam  Awake Alert, Oriented X 3, No new F.N deficits, Normal affect Symmetrical Chest wall movement, Good air movement bilaterally, CTAB RRR,No Gallops,Rubs or new Murmurs, No Parasternal Heave +ve B.Sounds, Abd Soft, No tenderness, No rebound - guarding or rigidity. No Cyanosis, Clubbing or edema, No new Rash or bruise       Data Review:    CBC Recent Labs  Lab 02/18/19 1454 02/20/19 0300  WBC 8.1 6.3  HGB 11.1* 10.3*  HCT 30.1* 29.2*  PLT 393 448*  MCV 75.8* 81.1  MCH 28.0 28.6  MCHC 36.9* 35.3  RDW 12.4 13.1  LYMPHSABS  --  1.2  MONOABS  --  0.5  EOSABS  --  0.2  BASOSABS  --  0.0    Chemistries  Recent  Labs  Lab 02/18/19 1551 02/19/19 0640 02/19/19 1245 02/19/19 2140 02/20/19 0300  NA 111* 120* 120* 121* 122*  K 2.4* 2.9* 3.2* 3.4* 3.6  CL 69* 80* 83* 83* 87*  CO2 30 26 26 26 24   GLUCOSE 151* 116* 145* 218* 168*  BUN 11 9 10 9 9   CREATININE 0.86 0.67 0.67 0.73 0.64  CALCIUM 8.8* 8.6* 8.9 9.0 9.0  MG 1.9  --  2.0  --  2.0  AST 67*  --   --   --  146*  ALT 93*  --   --   --  214*  ALKPHOS 82  --   --   --  106  BILITOT 0.8  --   --   --  0.4   ------------------------------------------------------------------------------------------------------------------ No results for input(s): CHOL, HDL, LDLCALC, TRIG, CHOLHDL, LDLDIRECT in the last 72 hours.  Lab Results  Component Value Date   HGBA1C 7.6 (H) 02/19/2019   ------------------------------------------------------------------------------------------------------------------ No results for input(s): TSH, T4TOTAL, T3FREE, THYROIDAB in the last 72 hours.  Invalid input(s): FREET3 ------------------------------------------------------------------------------------------------------------------ Recent Labs    02/19/19 0640  FERRITIN 898*    Coagulation profile No results for input(s): INR, PROTIME in the last 168 hours.  Recent Labs    02/19/19 0640  DDIMER 1.07*    Cardiac Enzymes No results for input(s): CKMB, TROPONINI, MYOGLOBIN in the last 168 hours.  Invalid input(s): CK ------------------------------------------------------------------------------------------------------------------ No results found for: BNP  Inpatient Medications  Scheduled Meds: . atenolol  50 mg Oral Daily  . enoxaparin (LOVENOX) injection  40 mg Subcutaneous Daily  . insulin aspart  0-5 Units Subcutaneous QHS  . insulin aspart  0-9 Units Subcutaneous TID WC  . lisinopril  40 mg Oral Daily  . phosphorus  250 mg Oral TID  . potassium chloride  40 mEq Oral Daily   Continuous Infusions: . sodium chloride 50 mL/hr at 02/20/19 0837    PRN Meds:.acetaminophen  Micro Results Recent Results (from the past 240 hour(s))  SARS Coronavirus 2 (CEPHEID- Performed in [facility] lab), Hosp Order     Status: Abnormal   Collection Time: 02/18/19  4:17 PM   Specimen: Nasopharyngeal Swab  Result Value Ref Range Status   SARS Coronavirus 2 POSITIVE (A) NEGATIVE Final    Comment: RESULT CALLED TO, READ BACK BY AND VERIFIED WITH: SHANNON MARTIN AT 1712 ON 02/18/2019 [facility]. (NOTE) If result is NEGATIVE SARS-CoV-2 target nucleic acids are NOT DETECTED. The SARS-CoV-2 RNA is generally detectable in upper and lower  respiratory specimens during the acute phase of infection. The lowest  concentration of SARS-CoV-2 viral copies this assay can detect is 250  copies / mL. A negative result does not preclude SARS-CoV-2 infection  and should not be used as the sole basis for treatment or other  patient management decisions.  A negative result may occur with  improper specimen collection / handling, submission of specimen other  than nasopharyngeal swab, presence of viral mutation(s) within the  areas targeted by this assay, and inadequate number of viral copies  (<250 copies / mL). A negative result must be combined with clinical  observations, patient history, and epidemiological information. If result is POSITIVE SARS-CoV-2 target nucleic acids are DETECTE D. The SARS-CoV-2 RNA is generally detectable in upper and lower  respiratory specimens during the acute phase of infection.  Positive  results are indicative of active infection with SARS-CoV-2.  Clinical  correlation with patient history and other diagnostic information is  necessary to determine patient infection status.  Positive results do  not rule out bacterial infection or co-infection with other viruses. If result is PRESUMPTIVE POSTIVE SARS-CoV-2 nucleic acids MAY BE PRESENT.   A presumptive positive result was obtained on the submitted specimen  and confirmed on  repeat testing.  While 2019 novel coronavirus  (SARS-CoV-2) nucleic acids may be present in the submitted sample  additional confirmatory testing may be necessary for epidemiological  and / or clinical management purposes  to differentiate between  SARS-CoV-2 and other Sarbecovirus currently known to infect humans.  If clinically indicated additional testing with an alternate test  methodology (LAB745 3) is advised. The SARS-CoV-2 RNA is generally  detectable in upper and lower respiratory specimens during the acute  phase of infection. The expected result is Negative. Fact Sheet for Patients:  BoilerBrush.com.cyhttps://www.fda.gov/media/136312/download Fact Sheet for Healthcare Providers: https://pope.com/https://www.fda.gov/media/136313/download This test is not yet approved or cleared by the [place] FDA and has been authorized for detection and/or diagnosis of SARS-CoV-2 by FDA under an Emergency Use Authorization (EUA).  This EUA will remain in effect (meaning this test can be used) for the duration of the COVID-19 declaration under Section 564(b)(1) of the Act, 21 U.S.C. section 360bbb-3(b)(1), unless the authorization is terminated or revoked sooner. Performed at [facility], [address] [place], [place] [address]     Radiology Reports Dg Chest Portable 1 View  Result Date: 02/18/2019 CLINICAL DATA:  Diagnosed with COVID-19  02/09/2019 who has been fever free past 4 days now with increased weakness and nausea. Diarrhea. EXAM: PORTABLE CHEST 1 VIEW COMPARISON:  None. FINDINGS: Lungs are adequately inflated demonstrate moderate peripheral airspace opacification over the predominant mid to lower lungs. No effusion. Cardiomediastinal silhouette and remainder the exam is unremarkable. IMPRESSION: Peripheral mid to lower lung predominant airspace process typical for multifocal pneumonia due to coronavirus in this patient who is known COVID-19 positive. Electronically Signed   By: Marin Olp M.D.    On: 02/18/2019 16:44    Time Spent in minutes  : 25 minutes   Phillips Climes M.D on 02/20/2019 at 3:15 PM  Between 7am to 7pm - Pager - [phone]  After 7pm go to [email or web address] - password [facility]  [facility] -  Office  [phone]

## 2019-02-20 NOTE — Progress Notes (Signed)
Pt ambulated around nurses station two times. Steady gait.  O2 ranged from 94-98% on r/a.

## 2019-02-21 LAB — C-REACTIVE PROTEIN: CRP: 8.1 mg/dL — ABNORMAL HIGH (ref <1.0)

## 2019-02-21 LAB — CBC WITH DIFFERENTIAL/PLATELET
Abs Immature Granulocytes: 0.05 10*3/uL (ref 0.00–0.07)
Basophils Absolute: 0 10*3/uL (ref 0.0–0.1)
Basophils Relative: 1 %
Eosinophils Absolute: 0.2 10*3/uL (ref 0.0–0.5)
Eosinophils Relative: 4 %
HCT: 31.1 % — ABNORMAL LOW (ref 39.0–52.0)
Hemoglobin: 10.6 g/dL — ABNORMAL LOW (ref 13.0–17.0)
Immature Granulocytes: 1 %
Lymphocytes Relative: 23 %
Lymphs Abs: 1.3 10*3/uL (ref 0.7–4.0)
MCH: 28.3 pg (ref 26.0–34.0)
MCHC: 34.1 g/dL (ref 30.0–36.0)
MCV: 82.9 fL (ref 80.0–100.0)
Monocytes Absolute: 0.4 10*3/uL (ref 0.1–1.0)
Monocytes Relative: 8 %
Neutro Abs: 3.6 10*3/uL (ref 1.7–7.7)
Neutrophils Relative %: 63 %
Platelets: 504 10*3/uL — ABNORMAL HIGH (ref 150–400)
RBC: 3.75 MIL/uL — ABNORMAL LOW (ref 4.22–5.81)
RDW: 13.4 % (ref 11.5–15.5)
WBC: 5.6 10*3/uL (ref 4.0–10.5)
nRBC: 0 % (ref 0.0–0.2)

## 2019-02-21 LAB — GLUCOSE, CAPILLARY
Glucose-Capillary: 169 mg/dL — ABNORMAL HIGH (ref 70–99)
Glucose-Capillary: 229 mg/dL — ABNORMAL HIGH (ref 70–99)
Glucose-Capillary: 178 mg/dL — ABNORMAL HIGH (ref 70–99)

## 2019-02-21 LAB — BASIC METABOLIC PANEL
Anion gap: 12 (ref 5–15)
Anion gap: 11 (ref 5–15)
BUN: 9 mg/dL (ref 6–20)
BUN: 10 mg/dL (ref 6–20)
CO2: 23 mmol/L (ref 22–32)
CO2: 23 mmol/L (ref 22–32)
Calcium: 9.1 mg/dL (ref 8.9–10.3)
Calcium: 9 mg/dL (ref 8.9–10.3)
Chloride: 92 mmol/L — ABNORMAL LOW (ref 98–111)
Chloride: 95 mmol/L — ABNORMAL LOW (ref 98–111)
Creatinine, Ser: 0.67 mg/dL (ref 0.61–1.24)
Creatinine, Ser: 0.65 mg/dL (ref 0.61–1.24)
GFR calc Af Amer: >60 mL/min (ref >60)
GFR calc Af Amer: >60 mL/min (ref >60)
GFR calc non Af Amer: >60 mL/min (ref >60)
GFR calc non Af Amer: >60 mL/min (ref >60)
Glucose, Bld: 155 mg/dL — ABNORMAL HIGH (ref 70–99)
Glucose, Bld: 158 mg/dL — ABNORMAL HIGH (ref 70–99)
Potassium: 3.8 mmol/L (ref 3.5–5.1)
Potassium: 3.8 mmol/L (ref 3.5–5.1)
Sodium: 127 mmol/L — ABNORMAL LOW (ref 135–145)
Sodium: 129 mmol/L — ABNORMAL LOW (ref 135–145)

## 2019-02-21 LAB — MAGNESIUM: Magnesium: 1.8 mg/dL (ref 1.7–2.4)

## 2019-02-21 LAB — PHOSPHORUS: Phosphorus: 3.3 mg/dL (ref 2.5–4.6)

## 2019-02-21 LAB — D-DIMER, QUANTITATIVE: D-Dimer, Quant: 0.77 ug/mL-FEU — ABNORMAL HIGH (ref 0.00–0.50)

## 2019-02-21 LAB — FERRITIN: Ferritin: 815 ng/mL — ABNORMAL HIGH (ref 24–336)

## 2019-02-21 MED ORDER — K PHOS MONO-SOD PHOS DI & MONO 155-852-130 MG PO TABS: 250.0000 mg | ORAL_TABLET | Freq: Three times a day (TID) | ORAL | 0 refills | Status: AC

## 2019-02-21 MED ORDER — ACETAMINOPHEN 325 MG PO TABS: 650.0000 mg | ORAL_TABLET | Freq: Four times a day (QID) | ORAL | Status: AC | PRN

## 2019-02-21 NOTE — Discharge Instructions (Signed)
Person Under Monitoring Name: Greg Stevenson  Location: [place] [address]   Infection Prevention Recommendations for Individuals Confirmed to have, or Being Evaluated for, 2019 Novel Coronavirus (COVID-19) Infection Who Receive Care at Home  Individuals who are confirmed to have, or are being evaluated for, COVID-19 should follow the prevention steps below until a healthcare provider or local or state health department says they can return to normal activities.  Stay home except to get medical care You should restrict activities outside your home, except for getting medical care. Do not go to work, school, or public areas, and do not use public transportation or taxis.  Call ahead before visiting your doctor Before your medical appointment, call the healthcare provider and tell them that you have, or are being evaluated for, COVID-19 infection. This will help the healthcare providers office take steps to keep other people from getting infected. Ask your healthcare provider to call the local or state health department.  Monitor your symptoms Seek prompt medical attention if your illness is worsening (e.g., difficulty breathing). Before going to your medical appointment, call the healthcare provider and tell them that you have, or are being evaluated for, COVID-19 infection. Ask your healthcare provider to call the local or state health department.  Wear a facemask You should wear a facemask that covers your nose and mouth when you are in the same room with other people and when you visit a healthcare provider. People who live with or visit you should also wear a facemask while they are in the same room with you.  Separate yourself from other people in your home As much as possible, you should stay in a different room from other people in your home. Also, you should use a separate bathroom, if available.  Avoid sharing household items You should not  share dishes, drinking glasses, cups, eating utensils, towels, bedding, or other items with other people in your home. After using these items, you should wash them thoroughly with soap and water.  Cover your coughs and sneezes Cover your mouth and nose with a tissue when you cough or sneeze, or you can cough or sneeze into your sleeve. Throw used tissues in a lined trash can, and immediately wash your hands with soap and water for at least 20 seconds or use an alcohol-based hand rub.  Wash your [facility] your hands often and thoroughly with soap and water for at least 20 seconds. You can use an alcohol-based hand sanitizer if soap and water are not available and if your hands are not visibly dirty. Avoid touching your eyes, nose, and mouth with unwashed hands.   Prevention Steps for Caregivers and Household Members of Individuals Confirmed to have, or Being Evaluated for, COVID-19 Infection Being Cared for in the Home  If you live with, or provide care at home for, a person confirmed to have, or being evaluated for, COVID-19 infection please follow these guidelines to prevent infection:  Follow healthcare providers instructions Make sure that you understand and can help the patient follow any healthcare provider instructions for all care.  Provide for the patients basic needs You should help the patient with basic needs in the home and provide support for getting groceries, prescriptions, and other personal needs.  Monitor the patients symptoms If they are getting sicker, call his or her medical provider and tell them that the patient has, or is being evaluated for, COVID-19 infection. This will help the healthcare providers office take  steps to keep other people from getting infected. Ask the healthcare provider to call the local or state health department.  Limit the number of people who have contact with the patient  If possible, have only one caregiver for the  patient.  Other household members should stay in another home or place of residence. If this is not possible, they should stay  in another room, or be separated from the patient as much as possible. Use a separate bathroom, if available.  Restrict visitors who do not have an essential need to be in the home.  Keep older adults, very young children, and other sick people away from the patient Keep older adults, very young children, and those who have compromised immune systems or chronic health conditions away from the patient. This includes people with chronic heart, lung, or kidney conditions, diabetes, and cancer.  Ensure good ventilation Make sure that shared spaces in the home have good air flow, such as from an air conditioner or an opened window, weather permitting.  Wash your hands often  Wash your hands often and thoroughly with soap and water for at least 20 seconds. You can use an alcohol based hand sanitizer if soap and water are not available and if your hands are not visibly dirty.  Avoid touching your eyes, nose, and mouth with unwashed hands.  Use disposable paper towels to dry your hands. If not available, use dedicated cloth towels and replace them when they become wet.  Wear a facemask and gloves  Wear a disposable facemask at all times in the room and gloves when you touch or have contact with the patients blood, body fluids, and/or secretions or excretions, such as sweat, saliva, sputum, nasal mucus, vomit, urine, or feces.  Ensure the mask fits over your nose and mouth tightly, and do not touch it during use.  Throw out disposable facemasks and gloves after using them. Do not reuse.  Wash your hands immediately after removing your facemask and gloves.  If your personal clothing becomes contaminated, carefully remove clothing and launder. Wash your hands after handling contaminated clothing.  Place all used disposable facemasks, gloves, and other waste in a lined  container before disposing them with other household waste.  Remove gloves and wash your hands immediately after handling these items.  Do not share dishes, glasses, or other household items with the patient  Avoid sharing household items. You should not share dishes, drinking glasses, cups, eating utensils, towels, bedding, or other items with a patient who is confirmed to have, or being evaluated for, COVID-19 infection.  After the person uses these items, you should wash them thoroughly with soap and water.  Wash laundry thoroughly  Immediately remove and wash clothes or bedding that have blood, body fluids, and/or secretions or excretions, such as sweat, saliva, sputum, nasal mucus, vomit, urine, or feces, on them.  Wear gloves when handling laundry from the patient.  Read and follow directions on labels of laundry or clothing items and detergent. In general, wash and dry with the warmest temperatures recommended on the label.  Clean all areas the individual has used often  Clean all touchable surfaces, such as counters, tabletops, doorknobs, bathroom fixtures, toilets, phones, keyboards, tablets, and bedside tables, every day. Also, clean any surfaces that may have blood, body fluids, and/or secretions or excretions on them.  Wear gloves when cleaning surfaces the patient has come in contact with.  Use a diluted bleach solution (e.g., dilute bleach with 1 part bleach  and 10 parts water) or a household disinfectant with a label that says EPA-registered for coronaviruses. To make a bleach solution at home, add 1 tablespoon of bleach to 1 quart (4 cups) of water. For a larger supply, add  cup of bleach to 1 gallon (16 cups) of water.  Read labels of cleaning products and follow recommendations provided on product labels. Labels contain instructions for safe and effective use of the cleaning product including precautions you should take when applying the product, such as wearing gloves or  eye protection and making sure you have good ventilation during use of the product.  Remove gloves and wash hands immediately after cleaning.  Monitor yourself for signs and symptoms of illness Caregivers and household members are considered close contacts, should monitor their health, and will be asked to limit movement outside of the home to the extent possible. Follow the monitoring steps for close contacts listed on the symptom monitoring form.   ? If you have additional questions, contact your local health department or call the epidemiologist on call at [phone] (available 24/7). ? This guidance is subject to change. For the most up-to-date guidance from [facility], please refer to their website: [email or web address]

## 2019-02-21 NOTE — Discharge Summary (Signed)
Greg Stevenson, is a 63 y.o. male  DOB [birth year]  MRN [id number].  Admission date:  02/19/2019  Admitting Physician  Dorcas Carrow, MD  Discharge Date:  02/21/2019   Primary MD  Mickey Farber, MD  Recommendations for primary care physician for things to follow:  - please check CBC, BMP, LFTs, phosphorus, magnesium during next visit   Admission Diagnosis  covid 19 ABD PAIN   Discharge Diagnosis  covid 19 ABD PAIN    Principal Problem:   Dehydration with hyponatremia Active Problems:   COVID-19 virus infection   Type 2 diabetes mellitus with hyperlipidemia (HCC)   Hypokalemia   Essential hypertension      Past Medical History:  Diagnosis Date   Anemia    Diabetes mellitus without complication (HCC)    Gastritis    H. pylori infection    Hyperlipidemia    Hypertension    Vitamin B12 deficiency     Past Surgical History:  Procedure Laterality Date   CATARACT EXTRACTION W/PHACO Left 05/09/2017   Procedure: CATARACT EXTRACTION PHACO AND INTRAOCULAR LENS PLACEMENT (IOC);  Surgeon: Nevada Crane, MD;  Location: [facility] ORS;  Service: Ophthalmology;  Laterality: Left;  Lot # [id number] H [place]:   00:18.5 AP%:  10.9 CDE:    2.02   CATARACT EXTRACTION W/PHACO Right 06/27/2017   Procedure: CATARACT EXTRACTION PHACO AND INTRAOCULAR LENS PLACEMENT (IOC)-RIGHT DIABETIC;  Surgeon: Nevada Crane, MD;  Location: [facility] ORS;  Service: Ophthalmology;  Laterality: Right;  [place] 00:26.9 AP% 6.7 CDE 1.78 Fluid Pack lot #[id number]   COLONOSCOPY     COLONOSCOPY WITH PROPOFOL N/A 09/16/2017   Procedure: COLONOSCOPY WITH PROPOFOL;  Surgeon: Christena Deem, MD;  Location[facility] ENDOSCOPY;  Service: Endoscopy;  Laterality: N/A;   EYE SURGERY         History of present illness and  Hospital Course:     Kindly see H&P for history of present illness and admission details, please review complete  Labs, Consult reports and Test reports for all details in brief  HPI  from the history and physical done on the day of admission 02/19/2019 HPI: Greg Stevenson is a 63 y.o. male with medical history significant of type 2 diabetes on oral hypoglycemics, hypertension on chlorthalidone and lisinopril, who presented to the emergency room with generalized weakness, malaise, decreased oral intake, poor appetite, frequent watery stool since about Last 4 days.  Patient had fever and some nausea 10 days ago, he was tested positive for COVID-19.  His fever improved, however patient had frequent diarrhea 2-3 times a day, watery with poor appetite associated with extreme weakness and malaise.  Currently denies any fever .  Denies any shortness of breath. ED Course: Hemodynamically stable.  On room air.No respiratory symptoms.  Afebrile.  Sodium was 110, potassium 2.4, chloride 67, normal renal functions and normal magnesium.  Patient was given 1 L of isotonic saline and is started on normal saline and transferred to [facility] for management of hyponatremia and dehydration.   Hospital  Course   62 y.o.malewith medical history significant oftype 2 diabetes , hypertension who presented to the emergency room with generalized weakness, diarrhea, weakness, he tested positive for COVID-19, no hypoxia or shortness of breath, work-up significant for hyponatremia sodium of 110, hypokalemia potassium of 2.4, [facility] .  Severe hyponatremia with dehydration: -Clinical picture consistent with clinical dehydration along with the use of chlorthalidone and may have been contributed byBuspirone.  Was kept on IV fluids during hospital stay, normal saline has been adjusted frequently to avoid rapid overcorrection of his hyponatremia, sodium was 110 on admission, sodium at time of discharge was 129, will recommend to continue holding chlorthalidone on discharge.  Hypophosphatemia    -Pleated, he will be discharged on total of 10 tablets of Neutra-Phos   Hypokalemia:  -Repleted  COVID-19 virus infection: With no obvious respiratory symptoms. So far no indication for treatment   type 2 diabetes: Resume home medication on discharge.  Hypertension: Blood pressures well controlled. Resume atenolol and lisinopril. Hold chlorthalidone.  Discharge Condition:  stable   Follow UP  Follow-up Information    Mickey Farberhies, David, MD Follow up in 1 week(s).   Specialty: Internal Medicine Contact information: [address] [facility] [place] [id number] [phone]             Discharge Instructions  and  Discharge Medications   Discharge Instructions    Discharge instructions   Complete by: As directed    Follow with Primary MD Mickey Farberhies, David, MD in 7 days   Get CBC, CMP,  checked  by Primary MD next visit.    Activity: As tolerated with Full fall precautions use walker/cane & assistance as needed   Disposition Home    Diet: Heart Healthy   On your next visit with your primary care physician please Get Medicines reviewed and adjusted.   Please request your Prim.MD to go over all Hospital Tests and Procedure/Radiological results at the follow up, please get all Hospital records sent to your Prim MD by signing hospital release before you go home.   If you experience worsening of your admission symptoms, develop shortness of breath, life threatening emergency, suicidal or homicidal thoughts you must seek medical attention immediately by calling 911 or calling your MD immediately  if symptoms less severe.  You Must read complete instructions/literature along with all the possible adverse reactions/side effects for all the Medicines you take and that have been prescribed to you. Take any new Medicines after you have completely understood and accpet all the possible adverse reactions/side effects.   Do not drive, operating heavy  machinery, perform activities at heights, swimming or participation in water activities or provide baby sitting services if your were admitted for syncope or siezures until you have seen by Primary MD or a Neurologist and advised to do so again.  Do not drive when taking Pain medications.    Do not take more than prescribed Pain, Sleep and Anxiety Medications  Special Instructions: If you have smoked or chewed Tobacco  in the last 2 yrs please stop smoking, stop any regular Alcohol  and or any Recreational drug use.  Wear Seat belts while driving.   Please note  You were cared for by a hospitalist during your hospital stay. If you have any questions about your discharge medications or the care you received while you were in the hospital after you are discharged, you can call the unit and asked to speak with  the hospitalist on call if the hospitalist that took care of you is not available. Once you are discharged, your primary care physician will handle any further medical issues. Please note that NO REFILLS for any discharge medications will be authorized once you are discharged, as it is imperative that you return to your primary care physician (or establish a relationship with a primary care physician if you do not have one) for your aftercare needs so that they can reassess your need for medications and monitor your lab values.   Increase activity slowly   Complete by: As directed    MyChart COVID-19 home monitoring program   Complete by: Feb 21, 2019    Is the patient willing to use the [place] for home monitoring?: Yes   Temperature monitoring   Complete by: Feb 21, 2019    After how many days would you like to receive a notification of this patient's flowsheet entries?: 1     Allergies as of 02/21/2019   No Known Allergies     Medication List    STOP taking these medications   chlorthalidone 25 MG tablet Commonly known as: HYGROTON     TAKE these medications     acetaminophen 325 MG tablet Commonly known as: TYLENOL Take 2 tablets (650 mg total) by mouth every 6 (six) hours as needed for mild pain or headache (fever >/= 101).   atenolol 50 MG tablet Commonly known as: TENORMIN Take 50 mg by mouth daily.   busPIRone 15 MG tablet Commonly known as: BUSPAR Take 5-15 mg by mouth 2 (two) times a day.   glimepiride 2 MG tablet Commonly known as: AMARYL Take 2 mg by mouth daily with breakfast.   lisinopril 40 MG tablet Commonly known as: ZESTRIL Take 40 mg by mouth daily.   lovastatin 40 MG tablet Commonly known as: MEVACOR Take 40 mg by mouth at bedtime.   metFORMIN 500 MG 24 hr tablet Commonly known as: GLUCOPHAGE-XR Take 500 mg by mouth 2 (two) times daily.   phosphorus 155-852-130 MG tablet Commonly known as: K PHOS NEUTRAL Take 1 tablet (250 mg total) by mouth 3 (three) times daily. Please take 10 tablets then stop         Diet and Activity recommendation: See Discharge Instructions above   Consults obtained -  None   Major procedures and Radiology Reports - PLEASE review detailed and final reports for all details, in brief -     Dg Chest Portable 1 View  Result Date: 02/18/2019 CLINICAL DATA:  Diagnosed with COVID-19 02/09/2019 who has been fever free past 4 days now with increased weakness and nausea. Diarrhea. EXAM: PORTABLE CHEST 1 VIEW COMPARISON:  None. FINDINGS: Lungs are adequately inflated demonstrate moderate peripheral airspace opacification over the predominant mid to lower lungs. No effusion. Cardiomediastinal silhouette and remainder the exam is unremarkable. IMPRESSION: Peripheral mid to lower lung predominant airspace process typical for multifocal pneumonia due to coronavirus in this patient who is known COVID-19 positive. Electronically Signed   By: Marin Olp M.D.   On: 02/18/2019 16:44    Micro Results    Recent Results (from the past 240 hour(s))  SARS Coronavirus 2 (CEPHEID- Performed in [facility] hospital lab), Hosp Order     Status: Abnormal   Collection Time: 02/18/19  4:17 PM   Specimen: Nasopharyngeal Swab  Result Value Ref Range Status   SARS Coronavirus 2 POSITIVE (A) NEGATIVE Final    Comment: RESULT CALLED TO,  READ BACK BY AND VERIFIED WITH: SHANNON MARTIN AT 1712 ON 02/18/2019 [facility]. (NOTE) If result is NEGATIVE SARS-CoV-2 target nucleic acids are NOT DETECTED. The SARS-CoV-2 RNA is generally detectable in upper and lower  respiratory specimens during the acute phase of infection. The lowest  concentration of SARS-CoV-2 viral copies this assay can detect is 250  copies / mL. A negative result does not preclude SARS-CoV-2 infection  and should not be used as the sole basis for treatment or other  patient management decisions.  A negative result may occur with  improper specimen collection / handling, submission of specimen other  than nasopharyngeal swab, presence of viral mutation(s) within the  areas targeted by this assay, and inadequate number of viral copies  (<250 copies / mL). A negative result must be combined with clinical  observations, patient history, and epidemiological information. If result is POSITIVE SARS-CoV-2 target nucleic acids are DETECTE D. The SARS-CoV-2 RNA is generally detectable in upper and lower  respiratory specimens during the acute phase of infection.  Positive  results are indicative of active infection with SARS-CoV-2.  Clinical  correlation with patient history and other diagnostic information is  necessary to determine patient infection status.  Positive results do  not rule out bacterial infection or co-infection with other viruses. If result is PRESUMPTIVE POSTIVE SARS-CoV-2 nucleic acids MAY BE PRESENT.   A presumptive positive result was obtained on the submitted specimen  and confirmed on repeat testing.  While 2019 novel coronavirus  (SARS-CoV-2) nucleic acids may be present in the submitted sample  additional  confirmatory testing may be necessary for epidemiological  and / or clinical management purposes  to differentiate between  SARS-CoV-2 and other Sarbecovirus currently known to infect humans.  If clinically indicated additional testing with an alternate test  methodology (LAB745 3) is advised. The SARS-CoV-2 RNA is generally  detectable in upper and lower respiratory specimens during the acute  phase of infection. The expected result is Negative. Fact Sheet for Patients:  BoilerBrush.com.cyhttps://www.fda.gov/media/136312/download Fact Sheet for Healthcare Providers: https://pope.com/https://www.fda.gov/media/136313/download This test is not yet approved or cleared by the [place] FDA and has been authorized for detection and/or diagnosis of SARS-CoV-2 by FDA under an Emergency Use Authorization (EUA).  This EUA will remain in effect (meaning this test can be used) for the duration of the COVID-19 declaration under Section 564(b)(1) of the Act, 21 U.S.C. section 360bbb-3(b)(1), unless the authorization is terminated or revoked sooner. Performed at [facility], [address] [place], [place] [address]        Today   Subjective:   Greg Stevenson today has no headache,no chest abdominal pain,no new weakness tingling or numbness, feels much better wants to go home today.   Objective:   Blood pressure (!) 163/74, pulse 71, temperature 97.8 F (36.6 C), temperature source Oral, resp. rate 13, height 5\' 4"  (1.626 m), weight 91.1 kg, SpO2 98 %.   Intake/Output Summary (Last 24 hours) at 02/21/2019 1824 Last data filed at 02/21/2019 1212 Gross per 24 hour  Intake 739.73 ml  Output 850 ml  Net -110.27 ml    Exam Awake Alert, Oriented x 3, No new F.N deficits, Normal affect Symmetrical Chest wall movement, Good air movement bilaterally, CTAB RRR,No Gallops,Rubs or new Murmurs, No Parasternal Heave +ve B.Sounds, Abd Soft, Non tender, No organomegaly appriciated, No rebound -guarding or  rigidity. No Cyanosis, Clubbing or edema, No new Rash or bruise  Data Review   CBC w Diff:  Lab Results  Component Value Date   WBC 5.6 02/21/2019   HGB 10.6 (L) 02/21/2019   HGB 12.3 (L) 04/22/2012   HCT 31.1 (L) 02/21/2019   HCT 36.4 (L) 04/22/2012   PLT 504 (H) 02/21/2019   PLT 244 04/22/2012   LYMPHOPCT 23 02/21/2019   LYMPHOPCT 38.2 04/22/2012   MONOPCT 8 02/21/2019   MONOPCT 6.7 04/22/2012   EOSPCT 4 02/21/2019   EOSPCT 2.0 04/22/2012   BASOPCT 1 02/21/2019   BASOPCT 0.3 04/22/2012    CMP:  Lab Results  Component Value Date   NA 129 (L) 02/21/2019   K 3.8 02/21/2019   CL 95 (L) 02/21/2019   CO2 23 02/21/2019   BUN 10 02/21/2019   CREATININE 0.65 02/21/2019   PROT 6.5 02/20/2019   ALBUMIN 2.4 (L) 02/20/2019   BILITOT 0.4 02/20/2019   ALKPHOS 106 02/20/2019   AST 146 (H) 02/20/2019   ALT 214 (H) 02/20/2019  .   Total Time in preparing paper work, data evaluation and todays exam - 35 minutes  Huey Bienen[place]awood Candice Lunney M.D on 02/21/2019 at 6:24 PM  [facility]   Office  [phone]

## 2022-04-15 NOTE — Discharge Instructions (Signed)
Instructions after Total Knee Replacement   Greg Stevenson, Jr., M.D.     Dept. of Orthopaedics & Sports Medicine  [facility]  [address]  [place], [place]  [address]  Phone: [phone]   Fax: [phone]    DIET: Drink plenty of non-alcoholic fluids. Resume your normal diet. Include foods high in fiber.  ACTIVITY:  You may use crutches or a walker with weight-bearing as tolerated, unless instructed otherwise. You may be weaned off of the walker or crutches by your Physical Therapist.  Do NOT place pillows under the knee. Anything placed under the knee could limit your ability to straighten the knee.   Continue doing gentle exercises. Exercising will reduce the pain and swelling, increase motion, and prevent muscle weakness.   Please continue to use the TED compression stockings for 6 weeks. You may remove the stockings at night, but should reapply them in the morning. Do not drive or operate any equipment until instructed.  WOUND CARE:  Continue to use the PolarCare or ice packs periodically to reduce pain and swelling. You may bathe or shower after the staples are removed at the first office visit following surgery.  MEDICATIONS: You may resume your regular medications. Please take the pain medication as prescribed on the medication. Do not take pain medication on an empty stomach. You have been given a prescription for a blood thinner (Lovenox or Coumadin). Please take the medication as instructed. (NOTE: After completing a 2 week course of Lovenox, take one Enteric-coated aspirin once a day. This along with elevation will help reduce the possibility of phlebitis in your operated leg.) Do not drive or drink alcoholic beverages when taking pain medications.  CALL THE OFFICE FOR: Temperature above 101 degrees Excessive bleeding or drainage on the dressing. Excessive swelling, coldness, or paleness of the toes. Persistent nausea and vomiting.  FOLLOW-UP:  You  should have an appointment to return to the office in 10-14 days after surgery. Arrangements have been made for continuation of Physical Therapy (either home therapy or outpatient therapy).   [facility] Department Directory         [email or web address]       [email or web address]          Cardiology  Appointments: [place] - [phone] [place] - [phone]  Endocrinology  Appointments: [place] - [phone] [place] - [phone]  Gastroenterology  Appointments: [place] - [phone] [place] - [phone]        General Surgery   Appointments: [place] - [phone]  Internal Medicine/Family Medicine  Appointments: [place] - [phone] [place] - [phone] [place] - [phone]  Metabolic and Weigh Loss Surgery  Appointments: [place] - [phone]        Neurology  Appointments: [place] - [phone] [place] - [phone]  Neurosurgery  Appointments: [place] - [phone]  Obstetrics & Gynecology  Appointments: [place] - [phone] [place] - [phone]        Pediatrics  Appointments: [place] - [phone] [place] - [phone]  Physiatry  Appointments: [place] -[phone]  Physical Therapy  Appointments: [place] - [phone] [place] - [phone]        Podiatry  Appointments: [place] - [phone] [place] - [phone]  Pulmonology  Appointments: [place] - [phone]  Rheumatology  Appointments:  - [phone]         Location: [facility]  [address] , [place]  [address]  [place] Location: [facility] [address] [place], [place]  [address]  [place] Location: [facility] [address] [place], [place]  [address]    

## 2022-04-19 ENCOUNTER — Inpatient Hospital Stay
Admission: RE | Admit: 2022-04-19 | Discharge: 2022-04-19 | Disposition: A | Discharge status: Home / Self Care | Payer: Commercial Managed Care - PPO | Source: Ambulatory Visit

## 2022-04-19 NOTE — Patient Instructions (Signed)
Your procedure is scheduled on: Monday April 30, 2022. Su procedimiento est programado para: Lunes 28 de Agosto del 2023. Report to Day Surgery inside [facility] 2nd floor, stop by admissions desk before getting on elevator. Presntese a: Diplomatic Services operational officer del [facility] 2ndo piso, registrese primero en admisiones antes de subir al elvador.  To find out your arrival time please call [phone] between 1PM - 3PM on Friday April 27, 2022. Para saber su hora de llegada por favor llame al [phone] Eusebio Me la 1PM - 3PM el da: Viernes 25 de Agosto del 2023.   Remember: Instructions that are not followed completely may result in serious medical risk, up to and including death,  or upon the discretion of your surgeon and anesthesiologist your surgery may need to be rescheduled.  Recuerde: Las instrucciones que no se siguen completamente Armed forces logistics/support/administrative officer en un riesgo de salud grave, incluyendo hasta  la [place] o a discrecin de su cirujano y Scientific laboratory technician, su ciruga se puede posponer.   __X_ 1.Do not eat food after midnight the night before your procedure. No    gum chewing or hard candies. You may drink clear liquids up to 2 hours     before you are scheduled to arrive for your surgery- DO not drink clear     Liquids within 2 hours of the start of your surgery.     Clear Liquids include:    water, apple juice without pulp, clear carbohydrate drink such as    Clearfast of Gartorade, Black Coffee or Tea (Do not add anything to coffee or tea).      No coma nada despus de la medianoche de la noche anterior a su    procedimiento. No coma chicles ni caramelos duros. Puede tomar    lquidos claros hasta 2 horas antes de su hora programada de llegada al     hospital para su procedimiento. No tome lquidos claros durante el     transcurso de las 2 horas de su llegada programada al hospital para su     procedimiento, ya que esto puede llevar a que su procedimiento se    retrase o  tenga que volver a Magazine features editor.  Los lquidos claros incluyen:          - Agua o jugo de [place] sin pulpa          - Bebidas claras con carbohidratos como ClearFast o Gatorade          - Caf negro o t claro (sin leche, sin cremas, no agregue nada al caf ni al t)  No tome nada que no est en esta lista.  Los pacientes con diabetes tipo 1 y tipo 2 solo deben Printmaker.  Llame a la clnica de PreCare o a la unidad de Same Day Surgery si  tiene alguna pregunta sobre estas instrucciones.   _X__ 2. Complete the "Ensure Clear Pre-surgery Clear Carbohydrate Drink" provided to you, 2 hours before arrival. **If you  are diabetic you will be provided with an alternative drink, Gatorade Zero or G2.  Complete la "Bebida de carbohidratos claros antes de la ciruga Asegrese de que se le proporcione", 2 horas antes de la llegada. **Si es diabtico se le             proporcionar una bebida alternativa, Gatorade Zero o G2.               _X__ 3.Do Not Smoke or use e-cigarettes For 24 Hours Prior to  Your Surgery.    Do not use any chewable tobacco products for at least 6   hours prior to surgery.    No fume ni use cigarrillos electrnicos durante las 24 horas previas    a su [place].  No use ningn producto de tabaco masticable durante   al menos 6 horas antes de la [place].     __X_ 4. No alcohol for 24 hours before or after surgery.    No tome alcohol durante las 24 horas antes ni despus de la [place].   ____5. On the morning of surgery brush your teeth with toothpaste and water, you                may rinse your mouth with mouthwash if you wish.  Do not swallow any toothpaste of mouthwash.   En la maana de la [place], cepllese los dientes con pasta de dientes y [place],                [place] enjuagarse la boca con enjuague bucal si lo desea. No ingiera ninguna pasta de dientes o enjuague bucal.   __X__ 6. Notify your doctor if there is any change in your medical condition (cold,fever,  infections).    Informe a su mdico si hay algn cambio en su condicin mdica  (resfriado, fiebre, infecciones).   Do not wear jewelry, make-up, hairpins, clips or nail polish.  No use joyas, maquillajes, pinzas/ganchos para el cabello ni esmalte de uas.  Do not wear lotions, powders, or perfumes. You may wear deodorant.  No use lociones, polvos o perfumes.  Puede usar desodorante.    Do not shave 48 hours prior to surgery. Men may shave face and neck.  No se afeite 48 horas antes de la [place].  Los hombres pueden [employer] cara  y el cuello.   Do not bring valuables to the hospital.   No lleve objetos de valor al hospital.  [facility] is not responsible for any belongings or valuables.  [place] no se hace responsable de ningn tipo de pertenencias u objetos de Licensed conveyancer.               Contacts, dentures or bridgework may not be worn into surgery.  Los lentes de [place], las dentaduras postizas o puentes no se pueden usar en la [place].   Leave your suitcase in the car. After surgery it may be brought to your room.  Deje su maleta en el auto.  Despus de la ciruga podr traerla a su habitacin.   For patients admitted to the hospital, discharge time is determined by your  treatment team.  Para los pacientes que sean ingresados al hospital, el tiempo en el cual se le  dar de alta es determinado por su equipo de [place].   Patients discharged the day of surgery will not be allowed to drive home. A los pacientes que se les da de alta el mismo da de la ciruga no se les permitir conducir a Higher education careers adviser.   __X__ Take these medicines the morning of surgery with A SIP OF WATER:          Tome estas medicinas la maana de la ciruga con UN SORBO DE AGUA:  1. atenolol (TENORMIN) 50 MG tablet  2.   3.   4.       5.  6.  ____ Fleet Enema (as directed)          Enema de [place] (segn lo indicado)  __X__ Use CHG Soap as directed          Utilice el jabn de CHG segn lo  indicado  ____ Use inhalers on the day of surgery          Use los inhaladores el da de la ciruga  __X__ Stop metformin 2 days prior to surgery          Deje de tomar el metformin 2 das antes de la ciruga    ____ Take 1/2 of usual insulin dose the night before surgery and none on the morning of surgery           Tome la mitad de la dosis habitual de insulina la noche antes de la [place] y no tome nada en la maana de la             ciruga  ____ Stop Coumadin/Plavix/aspirin on           Deje de tomar el Coumadin/Plavix/aspirina el da:  __X__ Stop Anti-inflammatories such as Ibuprofen, Aleve, Advil, Motrin, Naprosyn, Meloxicam, Lodine, Ketoralac, Midol, and aspirin containing products like Excedrin, Goody's and or BC Powders.          Deje de tomar antiinflamatorios como Ibuprofen, Aleve, Advil, Motrin, Naprosyn, Meloxicam, Lodine, Ketoralac, Midol, o productos con aspirina como Excedrin, Goody's and or BC Powders.   __X__ Stop supplements until after surgery            Deje de tomar suplementos hasta despus de la ciruga  ____ Bring C-Pap to the hospital          Lleve el C-Pap al hospital     If you have any questions regarding your pre-procedure instructions,  Please call Pre-admit Testing at [phone]  Si tiene alguna pregunta con respecto a las instrucciones previas al procedimiento, Llame a Pruebas previas a la admisin al [phone]

## 2022-04-30 ENCOUNTER — Ambulatory Visit
Admission: RE | Admit: 2022-04-30 | Payer: Commercial Managed Care - PPO | Source: Home / Self Care | Admitting: Orthopedic Surgery

## 2022-04-30 ENCOUNTER — Encounter: Admission: RE | Payer: Self-pay | Source: Home / Self Care

## 2022-04-30 DIAGNOSIS — E1169 Type 2 diabetes mellitus with other specified complication: Secondary | ICD-10-CM

## 2022-04-30 DIAGNOSIS — M1711 Unilateral primary osteoarthritis, right knee: Secondary | ICD-10-CM

## 2022-04-30 DIAGNOSIS — E876 Hypokalemia: Secondary | ICD-10-CM

## 2022-04-30 DIAGNOSIS — U071 COVID-19: Secondary | ICD-10-CM

## 2022-04-30 SURGERY — ARTHROPLASTY, KNEE, TOTAL, USING IMAGELESS COMPUTER-ASSISTED NAVIGATION
Anesthesia: Choice | Site: Knee | Laterality: Right

## 2023-06-17 ENCOUNTER — Encounter: Payer: Self-pay | Admitting: Internal Medicine

## 2023-06-25 ENCOUNTER — Ambulatory Visit: Payer: Commercial Managed Care - PPO | Admitting: Anesthesiology

## 2023-06-25 ENCOUNTER — Encounter: Payer: Self-pay | Admitting: Internal Medicine

## 2023-06-25 ENCOUNTER — Encounter
Admission: RE | Disposition: A | Discharge status: Home / Self Care | Payer: Self-pay | Source: Home / Self Care | Attending: Internal Medicine

## 2023-06-25 ENCOUNTER — Ambulatory Visit
Admission: RE | Admit: 2023-06-25 | Discharge: 2023-06-25 | Disposition: A | Discharge status: Home / Self Care | Payer: Commercial Managed Care - PPO | Attending: Internal Medicine | Admitting: Internal Medicine

## 2023-06-25 DIAGNOSIS — E538 Deficiency of other specified B group vitamins: Secondary | ICD-10-CM | POA: Insufficient documentation

## 2023-06-25 DIAGNOSIS — Z79899 Other long term (current) drug therapy: Secondary | ICD-10-CM | POA: Insufficient documentation

## 2023-06-25 DIAGNOSIS — Z860101 Personal history of adenomatous and serrated colon polyps: Secondary | ICD-10-CM | POA: Diagnosis not present

## 2023-06-25 DIAGNOSIS — Z1211 Encounter for screening for malignant neoplasm of colon: Secondary | ICD-10-CM | POA: Insufficient documentation

## 2023-06-25 DIAGNOSIS — Z87891 Personal history of nicotine dependence: Secondary | ICD-10-CM | POA: Insufficient documentation

## 2023-06-25 DIAGNOSIS — E119 Type 2 diabetes mellitus without complications: Secondary | ICD-10-CM | POA: Insufficient documentation

## 2023-06-25 DIAGNOSIS — I10 Essential (primary) hypertension: Secondary | ICD-10-CM | POA: Insufficient documentation

## 2023-06-25 DIAGNOSIS — Z7985 Long-term (current) use of injectable non-insulin antidiabetic drugs: Secondary | ICD-10-CM | POA: Diagnosis not present

## 2023-06-25 DIAGNOSIS — E785 Hyperlipidemia, unspecified: Secondary | ICD-10-CM | POA: Diagnosis not present

## 2023-06-25 DIAGNOSIS — K640 First degree hemorrhoids: Secondary | ICD-10-CM | POA: Insufficient documentation

## 2023-06-25 DIAGNOSIS — F419 Anxiety disorder, unspecified: Secondary | ICD-10-CM | POA: Diagnosis not present

## 2023-06-25 DIAGNOSIS — Z7984 Long term (current) use of oral hypoglycemic drugs: Secondary | ICD-10-CM | POA: Diagnosis not present

## 2023-06-25 HISTORY — PX: COLONOSCOPY WITH PROPOFOL: SHX5780

## 2023-06-25 HISTORY — DX: Personal history of other infectious and parasitic diseases: Z86.19

## 2023-06-25 HISTORY — DX: Anxiety disorder, unspecified: F41.9

## 2023-06-25 LAB — GLUCOSE, CAPILLARY: Glucose-Capillary: 84 mg/dL (ref 70–99)

## 2023-06-25 SURGERY — COLONOSCOPY WITH PROPOFOL
Anesthesia: General

## 2023-06-25 MED ORDER — SODIUM CHLORIDE 0.9 % IV SOLN: INTRAVENOUS | Status: DC | PRN

## 2023-06-25 MED ORDER — LIDOCAINE HCL (CARDIAC) PF 100 MG/5ML IV SOSY
PREFILLED_SYRINGE | INTRAVENOUS | Status: DC | PRN
  Administered 2023-06-25: 50 mg via INTRAVENOUS

## 2023-06-25 MED ORDER — EPHEDRINE SULFATE-NACL 50-0.9 MG/10ML-% IV SOSY
PREFILLED_SYRINGE | INTRAVENOUS | Status: DC | PRN
  Administered 2023-06-25 (×2): 10 mg via INTRAVENOUS

## 2023-06-25 MED ORDER — PROPOFOL 500 MG/50ML IV EMUL
INTRAVENOUS | Status: DC | PRN
  Administered 2023-06-25: 150 ug/kg/min via INTRAVENOUS

## 2023-06-25 MED ORDER — PROPOFOL 10 MG/ML IV BOLUS
INTRAVENOUS | Status: DC | PRN
  Administered 2023-06-25: 70 mg via INTRAVENOUS

## 2023-06-25 MED ORDER — SODIUM CHLORIDE 0.9 % IV SOLN
Freq: Once | INTRAVENOUS | Status: AC
  Administered 2023-06-25: 500 mL via INTRAVENOUS

## 2023-06-25 MED ORDER — EPHEDRINE 5 MG/ML INJ
INTRAVENOUS | Status: AC
  Filled 2023-06-25: qty 5

## 2023-06-25 NOTE — Anesthesia Preprocedure Evaluation (Addendum)
Anesthesia Evaluation  Patient identified by MRN, date of birth, ID band Patient awake    Reviewed: Allergy & Precautions, NPO status , Patient's Chart, lab work & pertinent test results  History of Anesthesia Complications Negative for: history of anesthetic complications  Airway Mallampati: III  TM Distance: >3 FB Neck ROM: Full    Dental  (+) Missing, Poor Dentition   Pulmonary neg sleep apnea, neg COPD, Patient abstained from smoking.Not current smoker, former smoker   Pulmonary exam normal breath sounds clear to auscultation       Cardiovascular Exercise Tolerance: Good METShypertension, Pt. on medications (-) CAD and (-) Past MI (-) dysrhythmias  Rhythm:Regular Rate:Normal - Systolic murmurs    Neuro/Psych  PSYCHIATRIC DISORDERS Anxiety     negative neurological ROS     GI/Hepatic ,neg GERD  ,,(+)     (-) substance abuse    Endo/Other  diabetes, Well Controlled  GLP1 agonist held for 7 days. Denies GI symptoms today  Renal/GU negative Renal ROS     Musculoskeletal   Abdominal   Peds  Hematology   Anesthesia Other Findings Past Medical History: No date: Anemia No date: Anxiety No date: Diabetes mellitus without complication (HCC) No date: Gastritis No date: H. pylori infection No date: History of chicken pox No date: Hyperlipidemia No date: Hypertension No date: Vitamin B12 deficiency  Reproductive/Obstetrics                             Anesthesia Physical Anesthesia Plan  ASA: 2  Anesthesia Plan: General   Post-op Pain Management: Minimal or no pain anticipated   Induction: Intravenous  PONV Risk Score and Plan: 2 and Propofol infusion, TIVA and Ondansetron  Airway Management Planned: Nasal Cannula  Additional Equipment: None  Intra-op Plan:   Post-operative Plan:   Informed Consent: I have reviewed the patients History and Physical, chart, labs and discussed  the procedure including the risks, benefits and alternatives for the proposed anesthesia with the patient or authorized representative who has indicated his/her understanding and acceptance.     Dental advisory given  Plan Discussed with: CRNA and Surgeon  Anesthesia Plan Comments: (Discussed risks of anesthesia with patient, including possibility of difficulty with spontaneous ventilation under anesthesia necessitating airway intervention, PONV, and rare risks such as cardiac or respiratory or neurological events, and allergic reactions. Discussed the role of CRNA in patient's perioperative care. Patient understands.)       Anesthesia Quick Evaluation

## 2023-06-25 NOTE — Anesthesia Postprocedure Evaluation (Signed)
Anesthesia Post Note  Patient: Greg Stevenson  Procedure(s) Performed: COLONOSCOPY WITH PROPOFOL  Patient location during evaluation: PACU Anesthesia Type: General Level of consciousness: awake and alert Pain management: pain level controlled Vital Signs Assessment: post-procedure vital signs reviewed and stable Respiratory status: spontaneous breathing, nonlabored ventilation, respiratory function stable and patient connected to nasal cannula oxygen Cardiovascular status: blood pressure returned to baseline and stable Postop Assessment: no apparent nausea or vomiting Anesthetic complications: no   No notable events documented.   Last Vitals:  Vitals:   06/25/23 1136 06/25/23 1146  BP: 128/65   Pulse: 67 72  Resp: 10 14  Temp: (!) 36 C   SpO2: 100% 100%    Last Pain:  Vitals:   06/25/23 1136  TempSrc: Temporal  PainSc: Asleep                 Corinda Gubler

## 2023-06-25 NOTE — Interval H&P Note (Signed)
History and Physical Interval Note:  06/25/2023 10:43 AM  Greg Stevenson  has presented today for surgery, with the diagnosis of Z86.010 (ICD-10-CM) - History of adenomatous polyp of colon.  The various methods of treatment have been discussed with the patient and family. After consideration of risks, benefits and other options for treatment, the patient has consented to  Procedure(s) with comments: COLONOSCOPY WITH PROPOFOL (N/A) - SPANISH INTERPRETER as a surgical intervention.  The patient's history has been reviewed, patient examined, no change in status, stable for surgery.  I have reviewed the patient's chart and labs.  Questions were answered to the patient's satisfaction.     [place], [place]

## 2023-06-25 NOTE — Transfer of Care (Signed)
Immediate Anesthesia Transfer of Care Note  Patient: Greg Stevenson  Procedure(s) Performed: COLONOSCOPY WITH PROPOFOL  Patient Location: PACU  Anesthesia Type:General  Level of Consciousness: sedated and responds to stimulation  Airway & Oxygen Therapy: Patient Spontanous Breathing  Post-op Assessment: Report given to RN and Post -op Vital signs reviewed and stable  Post vital signs: Reviewed and stable  Last Vitals:  Vitals Value Taken Time  BP 128/65 1135  Temp    Pulse 67 06/25/23 1135  Resp 10 06/25/23 1135  SpO2 100 % 06/25/23 1135  Vitals shown include unfiled device data.  Last Pain:  Vitals:   06/25/23 1032  TempSrc: Temporal  PainSc: 0-No pain         Complications: No notable events documented.

## 2023-06-25 NOTE — Op Note (Signed)
[name] Procedure Date: 06/25/2023 11:17 AM MRN: [id number] Account #: [id number] Date of Birth: 12/10/1955 Admit Type: Outpatient Age: 67 Room: [facility] ENDO ROOM 3 Gender: Male Note Status: Finalized Instrument Name: Prentice Docker [id number] Procedure:             Colonoscopy Indications:           Surveillance: Personal history of adenomatous polyps                         on last colonoscopy > 5 years ago Providers:             Royce Macadamia K. Norma Fredrickson MD, MD Referring MD:          Boykin Nearing. Norma Fredrickson MD, MD (Referring MD), Caryl Asp (Referring MD) Medicines:             Propofol per Anesthesia Complications:         No immediate complications. Procedure:             Pre-Anesthesia Assessment:                        - The risks and benefits of the procedure and the                         sedation options and risks were discussed with the                         patient. All questions were answered and informed                         consent was obtained.                        - Patient identification and proposed procedure were                         verified prior to the procedure by the nurse. The                         procedure was verified in the procedure room.                        - ASA Grade Assessment: III - A patient with severe                         systemic disease.                        - After reviewing the risks and benefits, the patient                         was deemed in satisfactory condition to undergo the                         procedure.  After obtaining informed consent, the colonoscope was                         passed under direct vision. Throughout the procedure,                         the patient's blood pressure, pulse, and oxygen                         saturations were monitored continuously. The                         Colonoscope  was introduced through the anus and                         advanced to the the cecum, identified by appendiceal                         orifice and ileocecal valve. The colonoscopy was                         performed without difficulty. The patient tolerated                         the procedure well. The quality of the bowel                         preparation was good. The ileocecal valve, appendiceal                         orifice, and rectum were photographed. Findings:      The perianal and digital rectal examinations were normal. Pertinent       negatives include normal sphincter tone and no palpable rectal lesions.      Non-bleeding internal hemorrhoids were found during retroflexion. The       hemorrhoids were Grade I (internal hemorrhoids that do not prolapse).      The exam was otherwise without abnormality. Impression:            - Non-bleeding internal hemorrhoids.                        - The examination was otherwise normal.                        - No specimens collected. Recommendation:        - Patient has a contact number available for                         emergencies. The signs and symptoms of potential                         delayed complications were discussed with the patient.                         Return to normal activities tomorrow. Written                         discharge instructions were provided to the patient.                        -  Resume previous diet.                        - Continue present medications.                        - Repeat colonoscopy in 10 years for screening                         purposes.                        - Return to GI office PRN.                        - The findings and recommendations were discussed with                         the patient. Procedure Code(s):     --- Professional ---                        Y4034, Colorectal cancer screening; colonoscopy on                         individual at high  risk Diagnosis Code(s):     --- Professional ---                        K64.0, First degree hemorrhoids                        Z86.010, Personal history of colonic polyps CPT copyright 2022 American Medical Association. All rights reserved. The codes documented in this report are preliminary and upon coder review may  be revised to meet current compliance requirements. Stanton Kidney MD, MD 06/25/2023 11:38:00 AM This report has been signed electronically. Number of Addenda: 0 Note Initiated On: 06/25/2023 11:17 AM Scope Withdrawal Time: 0 hours 4 minutes 24 seconds  Total Procedure Duration: 0 hours 6 minutes 34 seconds  Estimated Blood Loss:  Estimated blood loss: none.      [facility]

## 2023-06-25 NOTE — H&P (Signed)
Outpatient short stay form Pre-procedure 06/25/2023 10:42 AM Greg Stevenson K. Greg Stevenson, M.D.  Primary Physician: Laren Everts, NP  Reason for visit:  Personal history of adenomatous colon polyps  History of present illness:                            Patient presents for colonoscopy for a personal hx of colon polyps. The patient denies abdominal pain, abnormal weight loss or rectal bleeding.  Has the patient had any prior colon evaluation: [x]  Yes -Colonoscopy 09/16/2017 with Dr. Eber Hong @ [facility], +IH, NML - Fair prep (trilyte) -Colonoscopy 05/16/2012 with Dr. Eber Hong @ [facility] - Adenomatous Polyp   Does the patient have a family history of colon cancer: [x]  No  Does the patient have a family history of colon polyps: [x]  No    No current facility-administered medications for this encounter.  Medications Prior to Admission  Medication Sig Dispense Refill Last Dose   aspirin EC 81 MG tablet Take 81 mg by mouth daily. Swallow whole.   06/24/2023   atenolol (TENORMIN) 50 MG tablet Take 50 mg by mouth daily.   06/25/2023   B Complex Vitamins (VITAMIN B COMPLEX W/B-12) TABS Take 500 mcg by mouth daily.   06/24/2023   escitalopram (LEXAPRO) 20 MG tablet Take 20 mg by mouth daily.   06/24/2023   ferrous sulfate 220 (44 Fe) MG/5ML solution Take 220 mg by mouth daily.   Past Week   glimepiride (AMARYL) 2 MG tablet Take 2 mg by mouth daily with breakfast.   06/24/2023   hydrochlorothiazide (HYDRODIURIL) 25 MG tablet Take 25 mg by mouth daily.   06/25/2023   lisinopril (PRINIVIL,ZESTRIL) 40 MG tablet Take 40 mg by mouth daily.   06/25/2023   lovastatin (MEVACOR) 40 MG tablet Take 40 mg by mouth at bedtime.   06/24/2023   magnesium oxide (MAG-OX) 400 (240 Mg) MG tablet Take 400 mg by mouth daily.   Past Week   metFORMIN (GLUCOPHAGE-XR) 500 MG 24 hr tablet Take 500 mg by mouth 2 (two) times daily.   Past Week   phosphorus (K PHOS NEUTRAL) 155-852-130 MG tablet Take 1 tablet (250 mg total) by mouth 3  (three) times daily. Please take 10 tablets then stop 10 tablet 0 06/24/2023   rosuvastatin (CRESTOR) 5 MG tablet Take 5 mg by mouth daily.   06/24/2023   Semaglutide, 2 MG/DOSE, (OZEMPIC, 2 MG/DOSE,) 8 MG/3ML SOPN Inject 0.75 mg into the skin once a week.   06/18/2023   acetaminophen (TYLENOL) 325 MG tablet Take 2 tablets (650 mg total) by mouth every 6 (six) hours as needed for mild pain or headache (fever >/= 101).    at prn     Allergies  Allergen Reactions   Lovastatin Nausea Only     Past Medical History:  Diagnosis Date   Anemia    Anxiety    Diabetes mellitus without complication (HCC)    Gastritis    H. pylori infection    History of chicken pox    Hyperlipidemia    Hypertension    Vitamin B12 deficiency     Review of systems:  Otherwise negative.    Physical Exam  Gen: Alert, oriented. Appears stated age.  HEENT: [place]/AT. PERRLA. Lungs: CTA, no wheezes. CV: RR nl S1, S2. Abd: soft, benign, no masses. BS+ Ext: No edema. Pulses 2+    Planned procedures: Proceed with colonoscopy. The patient understands the nature of the  planned procedure, indications, risks, alternatives and potential complications including but not limited to bleeding, infection, perforation, damage to internal organs and possible oversedation/side effects from anesthesia. The patient agrees and gives consent to proceed.  Please refer to procedure notes for findings, recommendations and patient disposition/instructions.     Nao Linz K. Greg Stevenson, M.D. Gastroenterology 06/25/2023  10:42 AM

## 2023-06-26 ENCOUNTER — Encounter: Payer: Self-pay | Admitting: Internal Medicine

## 2024-03-11 ENCOUNTER — Ambulatory Visit (INDEPENDENT_AMBULATORY_CARE_PROVIDER_SITE_OTHER): Admitting: Podiatry

## 2024-03-11 ENCOUNTER — Encounter: Payer: Self-pay | Admitting: Podiatry

## 2024-03-11 ENCOUNTER — Ambulatory Visit (INDEPENDENT_AMBULATORY_CARE_PROVIDER_SITE_OTHER)

## 2024-03-11 VITALS — Ht 64.0 in | Wt 178.0 lb

## 2024-03-11 DIAGNOSIS — M722 Plantar fascial fibromatosis: Secondary | ICD-10-CM | POA: Diagnosis not present

## 2024-03-11 DIAGNOSIS — M7751 Other enthesopathy of right foot: Secondary | ICD-10-CM

## 2024-03-11 NOTE — Patient Instructions (Signed)
 VISIT SUMMARY: Today, you were seen for heel pain that has been bothering you for the past two months. The pain is located on the bottom of your heel and gets worse with activity, especially after sitting or lying down for a while. You have no history of injuries to the heel and have not experienced this type of pain before. Your diabetes is well-controlled, which allows for safe treatment options.  YOUR PLAN: -PLANTAR FASCIITIS: Plantar fasciitis is a condition where the ligament on the bottom of your foot becomes inflamed, causing heel pain. To help with the pain, you received a corticosteroid injection in your right heel. You are advised to take over-the-counter pain medications like ibuprofen or Aleve. Additionally, you should do the home physical therapy exercises provided and wear shoes with good insoles, such as KURU, HOKA, or ALTRA. If your pain does not improve by at least 50% in 6-8 weeks, please follow up for a potential second injection.  -TYPE 2 DIABETES MELLITUS, WELL CONTROLLED: Your Type 2 diabetes is well-controlled, which means your blood sugar levels are stable. This allows us  to safely administer the corticosteroid injection for your heel pain. Be aware that the injection may slightly increase your blood sugar levels temporarily, but it is considered safe given your current control.  INSTRUCTIONS: Please follow up in 6-8 weeks if your heel pain does not improve by at least 50% for a potential second injection.                                  Contains text generated by [employer].  Plantar Fasciitis (Heel Spur Syndrome) with Rehab The plantar fascia is a fibrous, ligament-like, soft-tissue structure that spans the bottom of the foot. Plantar fasciitis is a condition that causes pain in the foot due to inflammation of the tissue. SYMPTOMS  Pain and tenderness on the underneath side of the foot. Pain that worsens with standing or walking. CAUSES   Plantar fasciitis is caused by irritation and injury to the plantar fascia on the underneath side of the foot. Common mechanisms of injury include: Direct trauma to bottom of the foot. Damage to a small nerve that runs under the foot where the main fascia attaches to the heel bone. Stress placed on the plantar fascia due to bone spurs. RISK INCREASES WITH:  Activities that place stress on the plantar fascia (running, jumping, pivoting, or cutting). Poor strength and flexibility. Improperly fitted shoes. Tight calf muscles. Flat feet. Failure to warm-up properly before activity. Obesity. PREVENTION Warm up and stretch properly before activity. Allow for adequate recovery between workouts. Maintain physical fitness: Strength, flexibility, and endurance. Cardiovascular fitness. Maintain a health body weight. Avoid stress on the plantar fascia. Wear properly fitted shoes, including arch supports for individuals who have flat feet.  PROGNOSIS  If treated properly, then the symptoms of plantar fasciitis usually resolve without surgery. However, occasionally surgery is necessary.  RELATED COMPLICATIONS  Recurrent symptoms that may result in a chronic condition. Problems of the lower back that are caused by compensating for the injury, such as limping. Pain or weakness of the foot during push-off following surgery. Chronic inflammation, scarring, and partial or complete fascia tear, occurring more often from repeated injections.  TREATMENT  Treatment initially involves the use of ice and medication to help reduce pain and inflammation. The use of strengthening and stretching exercises may help reduce pain with activity, especially stretches of the Achilles tendon.  These exercises may be performed at home or with a therapist. Your caregiver may recommend that you use heel cups of arch supports to help reduce stress on the plantar fascia. Occasionally, corticosteroid injections are given to  reduce inflammation. If symptoms persist for greater than 6 months despite non-surgical (conservative), then surgery may be recommended.   MEDICATION  If pain medication is necessary, then nonsteroidal anti-inflammatory medications, such as aspirin and ibuprofen, or other minor pain relievers, such as acetaminophen , are often recommended. Do not take pain medication within 7 days before surgery. Prescription pain relievers may be given if deemed necessary by your caregiver. Use only as directed and only as much as you need. Corticosteroid injections may be given by your caregiver. These injections should be reserved for the most serious cases, because they may only be given a certain number of times.  HEAT AND COLD Cold treatment (icing) relieves pain and reduces inflammation. Cold treatment should be applied for 10 to 15 minutes every 2 to 3 hours for inflammation and pain and immediately after any activity that aggravates your symptoms. Use ice packs or massage the area with a piece of ice (ice massage). Heat treatment may be used prior to performing the stretching and strengthening activities prescribed by your caregiver, physical therapist, or athletic trainer. Use a heat pack or soak the injury in warm water.  SEEK IMMEDIATE MEDICAL CARE IF: Treatment seems to offer no benefit, or the condition worsens. Any medications produce adverse side effects.  EXERCISES- RANGE OF MOTION (ROM) AND STRETCHING EXERCISES - Plantar Fasciitis (Heel Spur Syndrome) These exercises may help you when beginning to rehabilitate your injury. Your symptoms may resolve with or without further involvement from your physician, physical therapist or athletic trainer. While completing these exercises, remember:  Restoring tissue flexibility helps normal motion to return to the joints. This allows healthier, less painful movement and activity. An effective stretch should be held for at least 30 seconds. A stretch should  never be painful. You should only feel a gentle lengthening or release in the stretched tissue.  RANGE OF MOTION - Toe Extension, Flexion Sit with your right / left leg crossed over your opposite knee. Grasp your toes and gently pull them back toward the top of your foot. You should feel a stretch on the bottom of your toes and/or foot. Hold this stretch for 10 seconds. Now, gently pull your toes toward the bottom of your foot. You should feel a stretch on the top of your toes and or foot. Hold this stretch for 10 seconds. Repeat  times. Complete this stretch 3 times per day.   RANGE OF MOTION - Ankle Dorsiflexion, Active Assisted Remove shoes and sit on a chair that is preferably not on a carpeted surface. Place right / left foot under knee. Extend your opposite leg for support. Keeping your heel down, slide your right / left foot back toward the chair until you feel a stretch at your ankle or calf. If you do not feel a stretch, slide your bottom forward to the edge of the chair, while still keeping your heel down. Hold this stretch for 10 seconds. Repeat 3 times. Complete this stretch 2 times per day.   STRETCH  Gastroc, Standing Place hands on wall. Extend right / left leg, keeping the front knee somewhat bent. Slightly point your toes inward on your back foot. Keeping your right / left heel on the floor and your knee straight, shift your weight toward the wall, not  allowing your back to arch. You should feel a gentle stretch in the right / left calf. Hold this position for 10 seconds. Repeat 3 times. Complete this stretch 2 times per day.  STRETCH  Soleus, Standing Place hands on wall. Extend right / left leg, keeping the other knee somewhat bent. Slightly point your toes inward on your back foot. Keep your right / left heel on the floor, bend your back knee, and slightly shift your weight over the back leg so that you feel a gentle stretch deep in your back calf. Hold this position  for 10 seconds. Repeat 3 times. Complete this stretch 2 times per day.  STRETCH  Gastrocsoleus, Standing  Note: This exercise can place a lot of stress on your foot and ankle. Please complete this exercise only if specifically instructed by your caregiver.  Place the ball of your right / left foot on a step, keeping your other foot firmly on the same step. Hold on to the wall or a rail for balance. Slowly lift your other foot, allowing your body weight to press your heel down over the edge of the step. You should feel a stretch in your right / left calf. Hold this position for 10 seconds. Repeat this exercise with a slight bend in your right / left knee. Repeat 3 times. Complete this stretch 2 times per day.   STRENGTHENING EXERCISES - Plantar Fasciitis (Heel Spur Syndrome)  These exercises may help you when beginning to rehabilitate your injury. They may resolve your symptoms with or without further involvement from your physician, physical therapist or athletic trainer. While completing these exercises, remember:  Muscles can gain both the endurance and the strength needed for everyday activities through controlled exercises. Complete these exercises as instructed by your physician, physical therapist or athletic trainer. Progress the resistance and repetitions only as guided.  STRENGTH - Towel Curls Sit in a chair positioned on a non-carpeted surface. Place your foot on a towel, keeping your heel on the floor. Pull the towel toward your heel by only curling your toes. Keep your heel on the floor. Repeat 3 times. Complete this exercise 2 times per day.  STRENGTH - Ankle Inversion Secure one end of a rubber exercise band/tubing to a fixed object (table, pole). Loop the other end around your foot just before your toes. Place your fists between your knees. This will focus your strengthening at your ankle. Slowly, pull your big toe up and in, making sure the band/tubing is positioned to  resist the entire motion. Hold this position for 10 seconds. Have your muscles resist the band/tubing as it slowly pulls your foot back to the starting position. Repeat 3 times. Complete this exercises 2 times per day.  Document Released: 08/20/2005 Document Revised: 11/12/2011 Document Reviewed: 12/02/2008 [facility] Patient Information 2014 [place], [place].

## 2024-03-12 NOTE — Progress Notes (Signed)
 Subjective:  Patient ID: Greg Stevenson, male    DOB: 10/25/1955,  MRN: [id number]  Chief Complaint  Patient presents with   Foot Pain    Pt is here due to right foot pain, he states the pain is in the heel of the foot this has been going on for 2 months, states pulsating pain in the heel. No injury to foot or prior treatment.    Discussed the use of AI scribe software for clinical note transcription with the patient, who gave verbal consent to proceed.  History of Present Illness Greg Stevenson is a 68 year old male who presents with heel pain.  He has been experiencing heel pain for approximately two months, which has progressively worsened. The pain is located on the bottom of the heel and is exacerbated by walking or engaging in activities around the house. It is particularly noticeable when he gets up to walk after being seated or lying down for a period of time.  No recent injuries to the heel. He has not experienced this type of pain before and has not been diagnosed with plantar fasciitis previously. He is able to take anti-inflammatory medications without issue.  His past medical history is significant for well-controlled diabetes. He has no known allergies.      Objective:    Physical Exam VASCULAR: DP and PT pulse palpable. Foot is warm and well-perfused. Capillary fill time is brisk. DERMATOLOGIC: Normal skin turgor, texture, and temperature. No open lesions, rashes, or ulcerations. NEUROLOGIC: Normal sensation to light touch and pressure. No paresthesias on examination. ORTHOPEDIC: Smooth pain-free range of motion of all examined joints. No ecchymosis or bruising. No gross deformity. No pain to palpation. Plantar medial band at central heel where plantar fascia attaches to medial calcaneus. No pain on posterior calcaneus. Minimal gastrocnemius splenus.   No images are attached to the encounter.    Results Procedure: Corticosteroid Injection Description: The right  heel was injected with twenty milligrams of Kenalog, four milligrams of dexamethasone, and five milligrams of lidocaine  and Marcaine following seroprep with alcohol. Informed Consent: Counseling about the risks, benefits, and alternatives to the corticosteroid injection was provided. The potential for a slight increase in blood sugar levels was discussed, and it was deemed safe given the patient's well-controlled diabetes.  RADIOLOGY Right foot radiographs: No fracture, stress fracture of the calcaneus, or heel spur (03/11/2024)   Assessment:   1. Plantar fasciitis of right foot      Plan:  Patient was evaluated and treated and all questions answered.  Assessment and Plan Assessment & Plan Plantar fasciitis Chronic plantar fasciitis for two months, located at the bottom of the heel, exacerbated by activity. X-ray shows no heel spur or fracture. Likely due to tension on the plantar fascia ligament. Expected improvement of at least 50% in 6-8 weeks with adherence to treatment plan. - Administer corticosteroid injection with 20 mg Kenalog, 4 mg dexamethasone, and 5 mg lidocaine  and Marcaine each to the right heel. - Recommend over-the-counter ibuprofen or Aleve for pain management. - Provide home physical therapy exercises and plan. - Advise wearing shoes with good insoles, such as KURU, HOKA, or ALTRA. - Instruct to follow up in 6-8 weeks if not at least 50% improved for potential second injection.  Type 2 diabetes mellitus, well controlled Well-controlled Type 2 diabetes allows for safe administration of corticosteroid injection. Corticosteroid injection may slightly increase blood sugar levels temporarily, but deemed safe given current control.      Return if symptoms  worsen or fail to improve.

## 2024-03-23 ENCOUNTER — Ambulatory Visit (INDEPENDENT_AMBULATORY_CARE_PROVIDER_SITE_OTHER): Admitting: Podiatry

## 2024-03-23 VITALS — Ht 64.0 in | Wt 178.0 lb

## 2024-03-23 DIAGNOSIS — M722 Plantar fascial fibromatosis: Secondary | ICD-10-CM | POA: Diagnosis not present

## 2024-03-23 MED ORDER — MELOXICAM 15 MG PO TABS: 15.0000 mg | ORAL_TABLET | Freq: Every day | ORAL | 3 refills | Status: AC

## 2024-03-23 NOTE — Progress Notes (Signed)
  Subjective:  Patient ID: Greg Stevenson, male    DOB: [birth year],  MRN: [id number]  Chief Complaint  Patient presents with   Foot Pain    Rm 6 Patient is here as a f/u right foot that radiating toward the knee.       History of Present Illness He returns for follow-up with no improvement feels like it is worse and now his knee is hurting because he is altering the way he walks      Objective:    Physical Exam VASCULAR: DP and PT pulse palpable. Foot is warm and well-perfused. Capillary fill time is brisk. DERMATOLOGIC: Normal skin turgor, texture, and temperature. No open lesions, rashes, or ulcerations. NEUROLOGIC: Normal sensation to light touch and pressure. No paresthesias on examination. ORTHOPEDIC: Smooth pain-free range of motion of all examined joints. No ecchymosis or bruising. No gross deformity. No pain to palpation. Plantar medial band at central heel where plantar fascia attaches to medial calcaneus. No pain on posterior calcaneus. Minimal gastrocnemius splenus.   No images are attached to the encounter.    Results Procedure: Corticosteroid Injection Description: The right heel was injected with twenty milligrams of Kenalog, four milligrams of dexamethasone , and five milligrams of lidocaine  and Marcaine following seroprep with alcohol. Informed Consent: Counseling about the risks, benefits, and alternatives to the corticosteroid injection was provided. The potential for a slight increase in blood sugar levels was discussed, and it was deemed safe given the patient's well-controlled diabetes.  RADIOLOGY Right foot radiographs: No fracture, stress fracture of the calcaneus, or heel spur (03/11/2024)   Assessment:   1. Plantar fasciitis of right foot      Plan:  Patient was evaluated and treated and all questions answered.  Assessment and Plan Assessment & Plan Plantar fasciitis Unalleviated by cortisone injection.  Recommended transition to cam boot for  support and immobilization and rest of the soft tissues.  Placed meloxicam  15 mg once daily.  If no improvement in 1 month would consider MRI.      Return in about 1 month (around 04/23/2024) for recheck plantar fasciitis L.
